# Patient Record
Sex: Female | Born: 1955 | Race: White | Hispanic: No | Smoking: Current every day smoker
Health system: Southern US, Community
[De-identification: ages and names within clinical notes are randomized; demographics above are authoritative.]

## PROBLEM LIST (undated history)

## (undated) DIAGNOSIS — M79605 Pain in left leg: Secondary | ICD-10-CM

## (undated) DIAGNOSIS — E079 Disorder of thyroid, unspecified: Secondary | ICD-10-CM

## (undated) DIAGNOSIS — T8859XA Other complications of anesthesia, initial encounter: Secondary | ICD-10-CM

## (undated) DIAGNOSIS — M51369 Other intervertebral disc degeneration, lumbar region without mention of lumbar back pain or lower extremity pain: Secondary | ICD-10-CM

## (undated) DIAGNOSIS — J189 Pneumonia, unspecified organism: Secondary | ICD-10-CM

## (undated) DIAGNOSIS — M25552 Pain in left hip: Secondary | ICD-10-CM

## (undated) DIAGNOSIS — K5900 Constipation, unspecified: Secondary | ICD-10-CM

## (undated) DIAGNOSIS — M5136 Other intervertebral disc degeneration, lumbar region: Secondary | ICD-10-CM

## (undated) DIAGNOSIS — I10 Essential (primary) hypertension: Secondary | ICD-10-CM

## (undated) DIAGNOSIS — E039 Hypothyroidism, unspecified: Secondary | ICD-10-CM

## (undated) DIAGNOSIS — M5410 Radiculopathy, site unspecified: Secondary | ICD-10-CM

## (undated) DIAGNOSIS — T4145XA Adverse effect of unspecified anesthetic, initial encounter: Secondary | ICD-10-CM

## (undated) DIAGNOSIS — R5383 Other fatigue: Secondary | ICD-10-CM

## (undated) DIAGNOSIS — R61 Generalized hyperhidrosis: Secondary | ICD-10-CM

## (undated) DIAGNOSIS — M549 Dorsalgia, unspecified: Secondary | ICD-10-CM

## (undated) HISTORY — PX: TONSILLECTOMY: SUR1361

## (undated) HISTORY — DX: Pain in left leg: M79.605

## (undated) HISTORY — DX: Dorsalgia, unspecified: M54.9

## (undated) HISTORY — DX: Pain in left hip: M25.552

## (undated) HISTORY — PX: ABDOMINAL HYSTERECTOMY: SHX81

## (undated) HISTORY — DX: Radiculopathy, site unspecified: M54.10

## (undated) HISTORY — DX: Disorder of thyroid, unspecified: E07.9

## (undated) HISTORY — DX: Constipation, unspecified: K59.00

## (undated) HISTORY — DX: Essential (primary) hypertension: I10

## (undated) HISTORY — PX: OTHER SURGICAL HISTORY: SHX169

## (undated) HISTORY — DX: Other intervertebral disc degeneration, lumbar region without mention of lumbar back pain or lower extremity pain: M51.369

## (undated) HISTORY — PX: SPINAL FUSION: SHX223

## (undated) HISTORY — DX: Other intervertebral disc degeneration, lumbar region: M51.36

## (undated) HISTORY — PX: GALLBLADDER SURGERY: SHX652

## (undated) HISTORY — PX: CHOLECYSTECTOMY: SHX55

## (undated) HISTORY — DX: Other fatigue: R53.83

## (undated) HISTORY — DX: Generalized hyperhidrosis: R61

---
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
## 1979-09-10 DIAGNOSIS — Z87442 Personal history of urinary calculi: Secondary | ICD-10-CM

## 1979-09-10 HISTORY — DX: Personal history of urinary calculi: Z87.442

## 2009-08-30 ENCOUNTER — Encounter (INDEPENDENT_AMBULATORY_CARE_PROVIDER_SITE_OTHER): Payer: Self-pay | Admitting: *Deleted

## 2010-09-09 HISTORY — PX: BACK SURGERY: SHX140

## 2011-05-01 ENCOUNTER — Other Ambulatory Visit: Payer: Self-pay | Admitting: Neurosurgery

## 2011-05-01 DIAGNOSIS — M419 Scoliosis, unspecified: Secondary | ICD-10-CM

## 2011-05-09 ENCOUNTER — Other Ambulatory Visit: Payer: Self-pay

## 2011-05-24 ENCOUNTER — Other Ambulatory Visit (HOSPITAL_COMMUNITY): Payer: Managed Care, Other (non HMO)

## 2011-05-29 ENCOUNTER — Encounter (HOSPITAL_COMMUNITY)
Admission: RE | Admit: 2011-05-29 | Discharge: 2011-05-29 | Disposition: A | Discharge status: Home / Self Care | Payer: Managed Care, Other (non HMO) | Source: Ambulatory Visit | Attending: Neurosurgery | Admitting: Neurosurgery

## 2011-05-29 LAB — SURGICAL PCR SCREEN: MRSA, PCR: NEGATIVE

## 2011-05-29 LAB — BASIC METABOLIC PANEL
BUN: 24 mg/dL — ABNORMAL HIGH (ref 6–23)
Chloride: 102 mEq/L (ref 96–112)
GFR calc Af Amer: >60 mL/min (ref >60)
GFR calc non Af Amer: >60 mL/min (ref >60)
Potassium: 4.7 mEq/L (ref 3.5–5.1)
Sodium: 137 mEq/L (ref 135–145)

## 2011-05-29 LAB — DIFFERENTIAL
Basophils Absolute: 0.1 10*3/uL (ref 0.0–0.1)
Basophils Relative: 1 % (ref 0–1)
Eosinophils Absolute: 0.7 10*3/uL (ref 0.0–0.7)
Eosinophils Relative: 7 % — ABNORMAL HIGH (ref 0–5)
Lymphocytes Relative: 36 % (ref 12–46)
Monocytes Absolute: 0.9 10*3/uL (ref 0.1–1.0)

## 2011-05-29 LAB — CBC
MCHC: 35.2 g/dL (ref 30.0–36.0)
Platelets: 317 10*3/uL (ref 150–400)
RDW: 14.2 % (ref 11.5–15.5)
WBC: 9.2 10*3/uL (ref 4.0–10.5)

## 2011-05-29 LAB — TYPE AND SCREEN: ABO/RH(D): O NEG

## 2011-05-29 LAB — ABO/RH: ABO/RH(D): O NEG

## 2011-05-30 ENCOUNTER — Inpatient Hospital Stay (HOSPITAL_COMMUNITY): Payer: Managed Care, Other (non HMO)

## 2011-05-30 ENCOUNTER — Inpatient Hospital Stay (HOSPITAL_COMMUNITY)
Admission: RE | Admit: 2011-05-30 | Discharge: 2011-06-02 | DRG: 458 | Disposition: A | Discharge status: Home Health | Payer: Managed Care, Other (non HMO) | Source: Ambulatory Visit | Attending: Neurosurgery | Admitting: Neurosurgery

## 2011-05-30 DIAGNOSIS — M4120 Other idiopathic scoliosis, site unspecified: Principal | ICD-10-CM | POA: Diagnosis present

## 2011-05-30 DIAGNOSIS — Z01812 Encounter for preprocedural laboratory examination: Secondary | ICD-10-CM

## 2011-05-30 DIAGNOSIS — M4310 Spondylolisthesis, site unspecified: Secondary | ICD-10-CM | POA: Diagnosis present

## 2011-05-30 DIAGNOSIS — M129 Arthropathy, unspecified: Secondary | ICD-10-CM | POA: Diagnosis present

## 2011-05-30 DIAGNOSIS — F17200 Nicotine dependence, unspecified, uncomplicated: Secondary | ICD-10-CM | POA: Diagnosis present

## 2011-05-30 DIAGNOSIS — M47817 Spondylosis without myelopathy or radiculopathy, lumbosacral region: Secondary | ICD-10-CM | POA: Diagnosis present

## 2011-06-04 NOTE — Op Note (Signed)
NAMEMarland Kitchen  Tammy Booth, Tammy Booth NO.:  [id number]  MEDICAL RECORD NO.:  [id number]  LOCATION:  3105                         FACILITY:  [facility]  PHYSICIAN:  Danae Orleans. Venetia Maxon, M.D.  DATE OF BIRTH:  [birth year]  DATE OF PROCEDURE:  05/30/2011 DATE OF DISCHARGE:                              OPERATIVE REPORT   PREOPERATIVE DIAGNOSES:  Lumbar scoliosis, spondylosis, spondylolisthesis stenosis, lumbar radiculopathy and low back pain.  POSTOPERATIVE DIAGNOSES:  Lumbar scoliosis, spondylosis, spondylolisthesis stenosis, lumbar radiculopathy and low back pain.  PROCEDURES: 1. L1-2, L2-3, L3-4, and L4-5 anterolateral decompression and fusion     with PEEK interbody cages and morselized allograft. 2. Posterior segmental pedicle screw fixation L1 through L5 levels     bilaterally.  SURGEON:  Danae Orleans. Venetia Maxon, MD  ASSISTANT:  Georgiann Cocker, RN and Hewitt Shorts, MD  ANESTHESIA:  General endotracheal anesthesia.  ESTIMATED BLOOD LOSS:  Minimal.  COMPLICATIONS:  None.  DISPOSITION:  To recovery.  INDICATIONS:  Tammy Booth is a 55 year old woman with severe lumbar scoliosis, spondylosis, spondylolisthesis stenosis and lumbar radiculopathy and low back pain, it was elected to take her to surgery for anterolateral decompression and fusion L1 through L5 levels.  PROCEDURE:  Tammy Booth was brought to the operating room.  Following satisfactory and uncomplicated induction of general endotracheal anesthesia, placement of intravenous lines and a Foley catheter, the patient was placed in left lateral decubitus position with axillary roll taped to the table for use of orthogonal intraoperative fluoroscopy. Initially, localizing at the L4-5 level, then moving up to the L1-2 level, anterolateral decompression and fusion was performed at each level.  The C-arm fluoroscopy was used throughout the surgery with AP and lateral visualization.  The thorough posterior incision was made  for finger dissection and then subsequently lateral incisions were made sequentially reorienting the table at each new level because of the rotatory component scoliosis.  At the L4-5 levels, thorough decompression was performed.  She had no level where there was any evidence of any neural stimulation at any point.  The electrical mapping was performed throughout the surgery, but initially at the L4-5 level, thorough decompression was performed with preparation at the endplates and removal of the disk material.  After trial sizing, it was elected to use a 10 x 22 x 50 10-degree lordotic implant which was packed with Osteocel Plus.  This was countersunk appropriately.  Attention was then turned to the L3-4 level where thorough decompression was performed and at this level, an 8 x 50 x 22 mm implant was packed in a similar fashion, inserted was countersunk appropriately.  At the L2-3 level, a coronal tapered implant was utilized as this was the apex of the scoliotic curvature and this was a 10 x 18 x 50-mm implant, which was packed in a similar fashion.  At the L1-2 level, the bone was quite soft and it was elected to place an 8 x 18 x 45-mm implant packed in a similar fashion where site subsidence in the superior endplate of L2, no violation of the L1 endplates.  Hemostasis was assured.  The wounds were irrigated and closed with Vicryl sutures, reapproximated the fascia, subcutaneous tissues and  the skin edges.  Dermabond was placed.  The patient was then turned to the prone position on the operating table on chest rolls and her back was then prepped and draped in usual sterile fashion.  Using percutaneous pedicle screw fixation, L1 through L5, 40 x 6.5-mm screws were placed at L5, 6.5 x 45-mm screws were placed at L1, L2, L3, and L4 levels.  A 160-mm rods were lordosed to fit the curvature and then were placed through separate stab incision.  They were locked down in situ with the  exception of the L2-3 convex side, which was compressed.  Final x-ray showed well-positioned interbody grafts, pedicle screw fixation at each level.  Wounds were irrigated and closed with 0 Vicryl, 2-0 and 3-0 Vicryl sutures.  The skin edges were reapproximated with Dermabond.  The patient was extubated in the operating room, and taken to the recovery in stable and satisfactory condition having tolerated the operation well.  Counts were correct at the end of the case.     Danae Orleans. Venetia Maxon, M.D.     JDS/MEDQ  D:  05/30/2011  T:  05/30/2011  Job:  [id number]  Electronically Signed by Maeola Harman M.D. on 06/04/2011 10:37:44 AM

## 2011-06-20 NOTE — Discharge Summary (Signed)
  NAMEMarland Kitchen  Tammy, Booth NO.:  [id number]  MEDICAL RECORD NO.:  [id number]  LOCATION:  3017                         FACILITY:  [facility]  PHYSICIAN:  Danae Orleans. Venetia Maxon, M.D.  DATE OF BIRTH:  [birth year]  DATE OF ADMISSION:  05/30/2011 DATE OF DISCHARGE:  06/02/2011                              DISCHARGE SUMMARY   PREADMISSION DIAGNOSES:  Lumbar scoliosis, spondylosis, spondylolisthesis, stenosis, lumbar radiculopathy and low back pain.  FINAL DIAGNOSES:  Lumbar scoliosis, spondylosis, spondylolisthesis, stenosis, lumbar radiculopathy and low back pain.  HISTORY OF ILLNESS AND HOSPITAL COURSE:  Tammy Booth is a 55 year old woman with severe back, lumbar scoliosis, spondylosis, spondylolisthesis, stenosis and lumbar radiculopathy.  It was elected to take her to surgery for anterolateral decompression and fusion at L1-L5 levels.  The patient was initially placed in a lateral position and underwent anterolateral decompression and fusion L1-L2, L2-3, L3-4 and L4-5 levels.  Subsequent to that is the same surgery she was repositioned in a prone position and underwent percutaneous pedicle screw fixation, L1-L5 levels.  She tolerated these procedures well, had no apparent complications postoperatively, was initially observed in the ICU postoperatively and was gradually mobilized, had an arterial line which was discontinued, was doing well on the 23rd and was discharged home on the 23rd with instructions of wearing back brace when up. Follow up with Dr. Venetia Maxon in the office 3 weeks postoperatively.  DISCHARGE MEDICATIONS:  Preoperative medications of vitamin D, vitamin C, multivitamin and calcium carbonate with final diagnoses same as admission diagnoses.     Danae Orleans. Venetia Maxon, M.D.     JDS/MEDQ  D:  06/18/2011  T:  06/19/2011  Job:  [id number]  Electronically Signed by Maeola Harman M.D. on 06/20/2011 09:37:09 AM

## 2013-09-09 DIAGNOSIS — C801 Malignant (primary) neoplasm, unspecified: Secondary | ICD-10-CM

## 2013-09-09 HISTORY — DX: Malignant (primary) neoplasm, unspecified: C80.1

## 2015-09-10 HISTORY — PX: CATARACT EXTRACTION, BILATERAL: SHX1313

## 2015-09-10 HISTORY — PX: EYE SURGERY: SHX253

## 2019-12-20 ENCOUNTER — Encounter: Payer: Self-pay | Admitting: Vascular Surgery

## 2019-12-21 ENCOUNTER — Other Ambulatory Visit: Payer: Self-pay | Admitting: Neurosurgery

## 2019-12-22 ENCOUNTER — Other Ambulatory Visit: Payer: Self-pay

## 2020-01-10 ENCOUNTER — Ambulatory Visit (INDEPENDENT_AMBULATORY_CARE_PROVIDER_SITE_OTHER): Payer: BC Managed Care – PPO | Admitting: Surgery

## 2020-01-10 ENCOUNTER — Other Ambulatory Visit: Payer: Self-pay

## 2020-01-10 ENCOUNTER — Encounter: Payer: Self-pay | Admitting: Surgery

## 2020-01-10 VITALS — BP 138/87 | HR 68 | Temp 97.9°F | Resp 20 | Ht 65.0 in | Wt 134.0 lb

## 2020-01-10 DIAGNOSIS — M479 Spondylosis, unspecified: Secondary | ICD-10-CM | POA: Diagnosis not present

## 2020-01-10 NOTE — Progress Notes (Signed)
[facility]  Patient name: Tammy Booth MRN: [id number] DOB: Dec 14, 1955 Sex: female   REQUESTING PROVIDER:    Dr. Vertell Limber    REASON FOR CONSULT:    ALIF L5-S1  HISTORY OF PRESENT ILLNESS:   Tammy Booth is a 64 y.o. female, who is referred for evaluation of anterior exposure of L5-S1.  Patient has a history of prior back surgery and is having recurrent issues.  Anterior approach has been recommended in addition to additional posterior work.  The patient's only abdominal surgeries have been a hysterectomy via a low transverse C-section incision and a laparoscopic cholecystectomy.  She is a smoker.  PAST MEDICAL HISTORY    Past Medical History:  Diagnosis Date  . Back pain   . Constipation   . DDD (degenerative disc disease), lumbar   . Fatigue   . Hypertension    PRIMARY  . Left hip pain   . Left leg pain   . Night sweats   . Radiculopathy    Lumbar region  . Thyroid disease      FAMILY HISTORY   Family History  Problem Relation Age of Onset  . Diabetes Mother   . Cancer Mother   . Hypertension Mother   . Heart disease Father   . Hypertension Father   . Cancer Father   . Cancer Sister   . Hypertension Sister   . Diabetes Other   . Cancer Other   . Dementia Other     SOCIAL HISTORY:   Social History   Socioeconomic History  . Marital status: Widowed    Spouse name: Not on file  . Number of children: Not on file  . Years of education: Not on file  . Highest education level: Not on file  Occupational History  . Occupation: Disabled  Tobacco Use  . Smoking status: Current Every Day Smoker    Packs/day: 1.50    Years: 44.00    Pack years: 66.00  Substance and Sexual Activity  . Alcohol use: Never  . Drug use: Never  . Sexual activity: Not on file  Other Topics Concern  . Not on file  Social History Narrative  . Not on file   Social Determinants of Health   Financial  Resource Strain:   . Difficulty of Paying Living Expenses:   Food Insecurity:   . Worried About Charity fundraiser in the Last Year:   . Arboriculturist in the Last Year:   Transportation Needs:   . Film/video editor (Medical):   Marland Kitchen Lack of Transportation (Non-Medical):   Physical Activity:   . Days of Exercise per Week:   . Minutes of Exercise per Session:   Stress:   . Feeling of Stress :   Social Connections:   . Frequency of Communication with Friends and Family:   . Frequency of Social Gatherings with Friends and Family:   . Attends Religious Services:   . Active Member of Clubs or Organizations:   . Attends Archivist Meetings:   Marland Kitchen Marital Status:   Intimate Partner Violence:   . Fear of Current or Ex-Partner:   . Emotionally Abused:   Marland Kitchen Physically Abused:   . Sexually Abused:     ALLERGIES:    No Known Allergies  CURRENT MEDICATIONS:    Current Outpatient Medications  Medication Sig Dispense Refill  . Calcium Carb-Cholecalciferol (CALCIUM 500+D PO) Take by mouth.    . carvedilol (COREG)  3.125 MG tablet Take 3.125 mg by mouth 2 (two) times daily with a meal.    . Cholecalciferol (VITAMIN D3) 1.25 MG (50000 UT) TABS Take by mouth.    Marland Kitchen ibuprofen (ADVIL) 200 MG tablet Take 200 mg by mouth every 6 (six) hours as needed.    Marland Kitchen levothyroxine (SYNTHROID) 112 MCG tablet Take 112 mcg by mouth daily before breakfast.    . LOW-DOSE ASPIRIN PO Take by mouth.    . polycarbophil (FIBERCON) 625 MG tablet Take 625 mg by mouth daily.     No current facility-administered medications for this visit.    REVIEW OF SYSTEMS:   [X]  denotes positive finding, [ ]  denotes negative finding Cardiac  Comments:  Chest pain or chest pressure:    Shortness of breath upon exertion:    Short of breath when lying flat:    Irregular heart rhythm:        Vascular    Pain in calf, thigh, or hip brought on by ambulation:    Pain in feet at night that wakes you up from your  sleep:     Blood clot in your veins:    Leg swelling:         Pulmonary    Oxygen at home:    Productive cough:     Wheezing:         Neurologic    Sudden weakness in arms or legs:     Sudden numbness in arms or legs:     Sudden onset of difficulty speaking or slurred speech:    Temporary loss of vision in one eye:     Problems with dizziness:         Gastrointestinal    Blood in stool:      Vomited blood:         Genitourinary    Burning when urinating:     Blood in urine:        Psychiatric    Major depression:         Hematologic    Bleeding problems:    Problems with blood clotting too easily:        Skin    Rashes or ulcers:        Constitutional    Fever or chills:     PHYSICAL EXAM:   There were no vitals filed for this visit.  GENERAL: The patient is a well-nourished female, in no acute distress. The vital signs are documented above. CARDIAC: There is a regular rate and rhythm.  VASCULAR: Palpable pedal pulses PULMONARY: Nonlabored respirations ABDOMEN: Soft and non-tender  MUSCULOSKELETAL: There are no major deformities or cyanosis. NEUROLOGIC: No focal weakness or paresthesias are detected. SKIN: There are no ulcers or rashes noted. PSYCHIATRIC: The patient has a normal affect.  STUDIES:   I have reviewed her prior x-rays and MRI which did not show any significant calcification within her vasculature  ASSESSMENT and PLAN   The patient is a good candidate for anterior exposure of the L5-S1 disc space.  I discussed the risk benefits of the procedure including but not limited to the risk of injury to the iliac artery and vein, the ureter.  We also discussed potential wound issues including infection and hernia.  All of her questions were answered.  Her procedure been scheduled for May 20.   Leia Alf, MD, FACS [facility] [phone] Pager [phone]

## 2020-01-21 NOTE — Progress Notes (Signed)
[id number] [address] [place] [address] Phone: [phone] Fax: [phone]      Your procedure is scheduled on Thursday, Jan 27, 2020.  Report to [facility] Main Entrance "A" at 5:30 A.M., and check in at the Admitting office.  Call this number if you have problems the morning of surgery:  [phone]  Call [phone] if you have any questions prior to your surgery date Monday-Friday 8am-4pm    Remember:  Do not eat or drink after midnight the night before your surgery    Take these medicines the morning of surgery with A SIP OF WATER:  carvedilol (COREG)  levothyroxine (SYNTHROID)   As of today, STOP taking any Aspirin (unless otherwise instructed by your surgeon) and Aspirin containing products, Aleve, Naproxen, Ibuprofen, Motrin, Advil, Goody's, BC's, all herbal medications, fish oil, and all vitamins.                      Do not wear jewelry, make up, or nail polish            Do not wear lotions, powders, perfumes, or deodorant.            Do not shave 48 hours prior to surgery.            Do not bring valuables to the hospital.            [facility] is not responsible for any belongings or valuables.  Do NOT Smoke (Tobacco/Vapping) or drink Alcohol 24 hours prior to your procedure If you use a CPAP at night, you may bring all equipment for your overnight stay.   Contacts, glasses, dentures or bridgework may not be worn into surgery.      For patients admitted to the hospital, discharge time will be determined by your treatment team.   Patients discharged the day of surgery will not be allowed to drive home, and someone needs to stay with them for 24 hours.    Special instructions:   [facility]- Preparing For Surgery  Before surgery, you can play an important role. Because skin is not sterile, your skin needs to be as free of germs as possible. You can reduce the number of  germs on your skin by washing with CHG (chlorahexidine gluconate) Soap before surgery.  CHG is an antiseptic cleaner which kills germs and bonds with the skin to continue killing germs even after washing.    Oral Hygiene is also important to reduce your risk of infection.  Remember - BRUSH YOUR TEETH THE MORNING OF SURGERY WITH YOUR REGULAR TOOTHPASTE  Please do not use if you have an allergy to CHG or antibacterial soaps. If your skin becomes reddened/irritated stop using the CHG.  Do not shave (including legs and underarms) for at least 48 hours prior to first CHG shower. It is OK to shave your face.  Please follow these instructions carefully.   1. Shower the NIGHT BEFORE SURGERY and the MORNING OF SURGERY with CHG Soap.   2. If you chose to wash your hair, wash your hair first as usual with your normal shampoo.  3. After you shampoo, rinse your hair and body thoroughly to remove the shampoo.  4. Use CHG as you would any other liquid soap. You can apply CHG directly to the skin and wash gently with a scrungie or a clean washcloth.   5. Apply the  CHG Soap to your body ONLY FROM THE NECK DOWN.  Do not use on open wounds or open sores. Avoid contact with your eyes, ears, mouth and genitals (private parts). Wash Face and genitals (private parts)  with your normal soap.   6. Wash thoroughly, paying special attention to the area where your surgery will be performed.  7. Thoroughly rinse your body with warm water from the neck down.  8. DO NOT shower/wash with your normal soap after using and rinsing off the CHG Soap.  9. Pat yourself dry with a CLEAN TOWEL.  10. Wear CLEAN PAJAMAS to bed the night before surgery, wear comfortable clothes the morning of surgery  11. Place CLEAN SHEETS on your bed the night of your first shower and DO NOT SLEEP WITH PETS.   Day of Surgery:   Do not apply any deodorants/lotions.  Please wear clean clothes to the hospital/surgery center.   Remember to  brush your teeth WITH YOUR REGULAR TOOTHPASTE.   Please read over the following fact sheets that you were given.

## 2020-01-24 ENCOUNTER — Inpatient Hospital Stay (HOSPITAL_COMMUNITY)
Admission: RE | Admit: 2020-01-24 | Discharge: 2020-01-24 | Disposition: A | Discharge status: Home / Self Care | Payer: Managed Care, Other (non HMO) | Source: Ambulatory Visit

## 2020-01-24 ENCOUNTER — Other Ambulatory Visit (HOSPITAL_COMMUNITY)
Admission: RE | Admit: 2020-01-24 | Discharge: 2020-01-24 | Disposition: A | Discharge status: Home / Self Care | Payer: BC Managed Care – PPO | Source: Ambulatory Visit | Attending: Orthopedic Surgery | Admitting: Orthopedic Surgery

## 2020-01-24 NOTE — H&P (Signed)
Patient ID:   [id number] Patient: Tammy Booth  Date of Birth: 08/23/1956 Visit Type: Office Visit   Date: 12/01/2019 02:15 PM Provider: Benito Lemmerman D. Alishia Lebo MD   This 64 year old female presents for MRI Review/back pain.  HISTORY OF PRESENT ILLNESS: 1.  MRI Review/back pain  Patient returns to review her MRI  Lumbar MRI read demonstrates severe left foraminal stenosis L5-S1.  She also has degeneration and scoliosis at the T12-L1 level on the right.  I have reviewed her scoliosis x-rays and discussed treatment options.  She has exhausted relief with L5 nerve blocks and is now having constant and unrelenting pain due to this nerve compression.  She has weakness in an L5 distribution with EHL and dorsiflexion weakness at 4/5 on the left with a positive seated straight leg raise at this level  I have recommended after lengthy discussion with the patient, ALIF at L5-S1 with extension of posterior fusion to the pelvis and also to the level of T10  Patient has a 21 degree Cobb angle at T 12 - L 1 and +13 degrees lumbar mismatch.    She describes her pain level as 5/10 consistently.    She is working on stopping smoking.      Medical/Surgical/Interim History Reviewed, no change.  Last detailed document date:10/15/2017.     PAST MEDICAL HISTORY, SURGICAL HISTORY, FAMILY HISTORY, SOCIAL HISTORY AND REVIEW OF SYSTEMS I have reviewed the patient's past medical, surgical, family and social history as well as the comprehensive review of systems as included on the [facility] history form dated 07/22/2019, which I have signed.  Family History: Reviewed, no changes.  Last detailed document date:10/15/2017.   Social History: Reviewed, no changes. Last detailed document date: 10/15/2017.    MEDICATIONS: (added, continued or stopped this visit) Started Medication Directions Instruction Stopped  Aspirin Low Dose 81 mg tablet,delayed  release     Calcium 500  500 mg calcium (1,250 mg) tablet     carvedilol 3.125 mg tablet     FiberCon 625 mg tablet     ibuprofen 200 mg tablet     levothyroxine 112 mcg tablet take 1 tablet by oral route  every day    Vitamin D3  ORAL       ALLERGIES: Ingredient Reaction Medication Name Comment NO KNOWN ALLERGIES    No known allergies. Reviewed, no changes.    PHYSICAL EXAM:  Vitals Date Temp F BP Pulse Ht In Wt Lb BMI BSA Pain Score 12/01/2019 97.3 132/82 71 65 136.4 22.7  5/10     IMPRESSION:  Progressive worsening of scoliosis and left L5 nerve root compression  PLAN: Proceed with ALIF L5-S1 with posterior fixation T10 to pelvis with exploration of prior fusion L1 through L5 levels with Vascular Surgery and intraoperative CT navigation with Airo.  Orders: Diagnostic Procedures: Assessment Procedure M54.16 Scoliosis- AP/Lat Instruction(s)/Education: Assessment Instruction I10 Lifestyle education Miscellaneous: Assessment  M41.26 TLSO Brace (Drawstring)  Completed Orders (this encounter) Order Details Reason Side Interpretation Result Initial Treatment Date Region Lifestyle education Patient will follow up with Primary Care Physician.        Assessment/Plan  # Detail Type Description  1. Assessment Low back pain, unspecified back pain laterality, with sciatica presence unspecified (M54.5).     2. 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  Plan Orders TLSO Brace (Drawstring). Clinical information/comments: [facility] - given to patient.     3. Assessment Radiculopathy, lumbar region (M54.16).     4.   Assessment Degenerative lumbar spinal stenosis (M48.061).     5. Assessment Essential (primary) hypertension (I10).       Pain Management Plan Pain Scale: 5/10. Method: Numeric Pain Intensity Scale. Location: back. Onset: 06/14/2017. Duration:  varies. Quality: discomforting. Pain management follow-up plan of care: Patient will continue medication management..              Provider:  Varian Innes D. Faatimah Spielberg MD  12/06/2019 08:40 AM    Dictation edited by: Winferd Wease D. Dejohn Ibarra    CC Providers: John  [place] [address] [place] [address] [place]  [address]   Yeriel Mineo MD  [address] [place], [place] [address]               Electronically signed by Lauriana Denes D. Alley Neils MD on 12/06/2019 08:40 AM  

## 2020-01-27 ENCOUNTER — Encounter (HOSPITAL_COMMUNITY): Admission: RE | Payer: Self-pay | Source: Home / Self Care

## 2020-01-27 ENCOUNTER — Inpatient Hospital Stay (HOSPITAL_COMMUNITY): Admission: RE | Admit: 2020-01-27 | Payer: BC Managed Care – PPO | Source: Home / Self Care | Admitting: Neurosurgery

## 2020-01-27 SURGERY — ANTERIOR LUMBAR FUSION 1 LEVEL
Anesthesia: General | Site: Back

## 2020-01-31 ENCOUNTER — Other Ambulatory Visit: Payer: Self-pay | Admitting: Neurosurgery

## 2020-02-11 ENCOUNTER — Other Ambulatory Visit: Payer: Self-pay

## 2020-03-07 NOTE — H&P (Signed)
Patient ID:   [id number] Patient: Tammy Booth  Date of Birth: 01/07/1956 Visit Type: Office Visit   Date: 12/01/2019 02:15 PM Provider: Kharlie Bring D. Lovelyn Sheeran MD   This 64 year old female presents for MRI Review/back pain.  HISTORY OF PRESENT ILLNESS: 1.  MRI Review/back pain  Patient returns to review her MRI  Lumbar MRI read demonstrates severe left foraminal stenosis L5-S1.  She also has degeneration and scoliosis at the T12-L1 level on the right.  I have reviewed her scoliosis x-rays and discussed treatment options.  She has exhausted relief with L5 nerve blocks and is now having constant and unrelenting pain due to this nerve compression.  She has weakness in an L5 distribution with EHL and dorsiflexion weakness at 4/5 on the left with a positive seated straight leg raise at this level  I have recommended after lengthy discussion with the patient, ALIF at L5-S1 with extension of posterior fusion to the pelvis and also to the level of T10  Patient has a 21 degree Cobb angle at T 12 - L 1 and +13 degrees lumbar mismatch.    She describes her pain level as 5/10 consistently.    She is working on stopping smoking.      Medical/Surgical/Interim History Reviewed, no change.  Last detailed document date:10/15/2017.     PAST MEDICAL HISTORY, SURGICAL HISTORY, FAMILY HISTORY, SOCIAL HISTORY AND REVIEW OF SYSTEMS I have reviewed the patient's past medical, surgical, family and social history as well as the comprehensive review of systems as included on the  [facility] history form dated 07/22/2019, which I have signed.  Family History: Reviewed, no changes.  Last detailed document date:10/15/2017.   Social History: Reviewed, no changes. Last detailed document date: 10/15/2017.    MEDICATIONS: (added, continued or stopped this visit) Started Medication Directions Instruction Stopped  Aspirin Low Dose 81 mg tablet,delayed  release     Calcium 500  500 mg calcium (1,250 mg) tablet     carvedilol 3.125 mg tablet     FiberCon 625 mg tablet     ibuprofen 200 mg tablet     levothyroxine 112 mcg tablet take 1 tablet by oral route  every day    Vitamin D3  ORAL       ALLERGIES: Ingredient Reaction Medication Name Comment NO KNOWN ALLERGIES    No known allergies. Reviewed, no changes.    PHYSICAL EXAM:  Vitals Date Temp F BP Pulse Ht In Wt Lb BMI BSA Pain Score 12/01/2019 97.3 132/82 71 65 136.4 22.7  5/10     IMPRESSION:  Progressive worsening of scoliosis and left L5 nerve root compression  PLAN: Proceed with ALIF L5-S1 with posterior fixation T10 to pelvis with exploration of prior fusion L1 through L5 levels with Vascular Surgery and intraoperative CT navigation with Airo.  Orders: Diagnostic Procedures: Assessment Procedure M54.16 Scoliosis- AP/Lat Instruction(s)/Education: Assessment Instruction I10 Lifestyle education Miscellaneous: Assessment  M41.26 TLSO Brace (Drawstring)  Completed Orders (this encounter) Order Details Reason Side Interpretation Result Initial Treatment Date Region Lifestyle education Patient will follow up with Primary Care Physician.        Assessment/Plan  # Detail Type Description  1. Assessment Low back pain, unspecified back pain laterality, with sciatica presence unspecified (M54.5).     2. 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  Plan Orders TLSO Brace (Drawstring). Clinical information/comments: COPC - given to patient.     3. Assessment Radiculopathy, lumbar region (M54.16).     4.   Assessment Degenerative lumbar spinal stenosis (M48.061).     5. Assessment Essential (primary) hypertension (I10).       Pain Management Plan Pain Scale: 5/10. Method: Numeric Pain Intensity Scale. Location: back. Onset: 06/14/2017. Duration:  varies. Quality: discomforting. Pain management follow-up plan of care: Patient will continue medication management..              Provider:  Charlese Gruetzmacher D. Anndee Connett MD  12/06/2019 08:40 AM    Dictation edited by: Mubarak Bevens D. Laurian Edrington    CC Providers: John  [place] [address] [place] [address] [place]  [address]   Mel Langan MD  [address] [place], [place] [address]               Electronically signed by Autymn Omlor D. Kaylina Cahue MD on 12/06/2019 08:40 AM  

## 2020-03-20 ENCOUNTER — Other Ambulatory Visit (HOSPITAL_COMMUNITY)
Admission: RE | Admit: 2020-03-20 | Discharge: 2020-03-20 | Disposition: A | Discharge status: Home / Self Care | Payer: BC Managed Care – PPO | Source: Ambulatory Visit | Attending: Neurosurgery | Admitting: Neurosurgery

## 2020-03-20 ENCOUNTER — Encounter (HOSPITAL_COMMUNITY): Payer: Self-pay

## 2020-03-20 ENCOUNTER — Other Ambulatory Visit: Payer: Self-pay

## 2020-03-20 ENCOUNTER — Encounter (HOSPITAL_COMMUNITY)
Admission: RE | Admit: 2020-03-20 | Discharge: 2020-03-20 | Disposition: A | Discharge status: Home / Self Care | Payer: BC Managed Care – PPO | Source: Ambulatory Visit | Attending: Neurosurgery | Admitting: Neurosurgery

## 2020-03-20 DIAGNOSIS — Z20822 Contact with and (suspected) exposure to covid-19: Secondary | ICD-10-CM | POA: Diagnosis not present

## 2020-03-20 DIAGNOSIS — Z01812 Encounter for preprocedural laboratory examination: Secondary | ICD-10-CM | POA: Insufficient documentation

## 2020-03-20 DIAGNOSIS — I10 Essential (primary) hypertension: Secondary | ICD-10-CM | POA: Diagnosis not present

## 2020-03-20 DIAGNOSIS — F17210 Nicotine dependence, cigarettes, uncomplicated: Secondary | ICD-10-CM | POA: Diagnosis not present

## 2020-03-20 HISTORY — DX: Hypothyroidism, unspecified: E03.9

## 2020-03-20 LAB — TYPE AND SCREEN
ABO/RH(D): O NEG
Antibody Screen: NEGATIVE

## 2020-03-20 LAB — SURGICAL PCR SCREEN
MRSA, PCR: NEGATIVE
Staphylococcus aureus: NEGATIVE

## 2020-03-20 LAB — BASIC METABOLIC PANEL
Anion gap: 11 (ref 5–15)
BUN: 25 mg/dL — ABNORMAL HIGH (ref 8–23)
CO2: 23 mmol/L (ref 22–32)
Calcium: 9.4 mg/dL (ref 8.9–10.3)
Chloride: 106 mmol/L (ref 98–111)
Creatinine, Ser: 1.34 mg/dL — ABNORMAL HIGH (ref 0.44–1.00)
GFR calc Af Amer: 48 mL/min — ABNORMAL LOW (ref >60)
GFR calc non Af Amer: 42 mL/min — ABNORMAL LOW (ref >60)
Glucose, Bld: 132 mg/dL — ABNORMAL HIGH (ref 70–99)
Potassium: 3.9 mmol/L (ref 3.5–5.1)
Sodium: 140 mmol/L (ref 135–145)

## 2020-03-20 LAB — CBC
HCT: 40.3 % (ref 36.0–46.0)
Hemoglobin: 13.5 g/dL (ref 12.0–15.0)
MCH: 29.6 pg (ref 26.0–34.0)
MCHC: 33.5 g/dL (ref 30.0–36.0)
MCV: 88.4 fL (ref 80.0–100.0)
Platelets: 404 10*3/uL — ABNORMAL HIGH (ref 150–400)
RBC: 4.56 MIL/uL (ref 3.87–5.11)
RDW: 15 % (ref 11.5–15.5)
WBC: 8.3 10*3/uL (ref 4.0–10.5)
nRBC: 0 % (ref 0.0–0.2)

## 2020-03-20 LAB — SARS CORONAVIRUS 2 (TAT 6-24 HRS): SARS Coronavirus 2: NEGATIVE

## 2020-03-20 NOTE — Progress Notes (Signed)
Your procedure is scheduled on Thursday July 15.  Report to [facility] Main Entrance "A" at 05:30 A.M., and check in at the Admitting office.  Call this number if you have problems the morning of surgery: [phone]  Call [phone] if you have any questions prior to your surgery date Monday-Friday 8am-4pm   Remember: Do not eat or drink after midnight the night before your surgery  Take these medicines the morning of surgery with A SIP OF WATER: carvedilol (COREG)  levothyroxine (SYNTHROID)  As of today, STOP taking any Aspirin (unless otherwise instructed by your surgeon), Aleve, Naproxen, Ibuprofen, Motrin, Advil, Goody's, BC's, all herbal medications, fish oil, and all vitamins.    The Morning of Surgery  Do not wear jewelry, make-up or nail polish.  Do not wear lotions, powders, or perfumes, or deodorant  Do not shave 48 hours prior to surgery.    Do not bring valuables to the hospital.  [facility] is not responsible for any belongings or valuables.  If you are a smoker, DO NOT Smoke 24 hours prior to surgery  If you wear a CPAP at night please bring your mask the morning of surgery   Remember that you must have someone to transport you home after your surgery, and remain with you for 24 hours if you are discharged the same day.   Please bring cases for contacts, glasses, hearing aids, dentures or bridgework because it cannot be worn into surgery.    Leave your suitcase in the car.  After surgery it may be brought to your room.  For patients admitted to the hospital, discharge time will be determined by your treatment team.  Patients discharged the day of surgery will not be allowed to drive home.    Special instructions:   [facility]- Preparing For Surgery  Before surgery, you can play an important role. Because skin is not sterile, your skin needs to be as free of germs as possible. You can reduce the number of germs on your skin by washing with CHG  (chlorahexidine gluconate) Soap before surgery.  CHG is an antiseptic cleaner which kills germs and bonds with the skin to continue killing germs even after washing.    Oral Hygiene is also important to reduce your risk of infection.  Remember - BRUSH YOUR TEETH THE MORNING OF SURGERY WITH YOUR REGULAR TOOTHPASTE  Please do not use if you have an allergy to CHG or antibacterial soaps. If your skin becomes reddened/irritated stop using the CHG.  Do not shave (including legs and underarms) for at least 48 hours prior to first CHG shower. It is OK to shave your face.  Please follow these instructions carefully.   1. Shower the NIGHT BEFORE SURGERY and the MORNING OF SURGERY with CHG Soap.   2. If you chose to wash your hair and body, wash as usual with your normal shampoo and body-wash/soap.  3. Rinse your hair and body thoroughly to remove the shampoo and soap.  4. Apply CHG directly to the skin (ONLY FROM THE NECK DOWN) and wash gently with a scrungie or a clean washcloth.   5. Do not use on open wounds or open sores. Avoid contact with your eyes, ears, mouth and genitals (private parts). Wash Face and genitals (private parts)  with your normal soap.   6. Wash thoroughly, paying special attention to the area where your surgery will be performed.  7. Thoroughly rinse your body with warm water from the neck down.  8. DO NOT shower/wash with your normal soap after using and rinsing off the CHG Soap.  9. Pat yourself dry with a CLEAN TOWEL.  10. Wear CLEAN PAJAMAS to bed the night before surgery  11. Place CLEAN SHEETS on your bed the night of your first shower and DO NOT SLEEP WITH PETS.  12. Wear comfortable clothes the morning of surgery.     Day of Surgery:  Please shower the morning of surgery with the CHG soap Do not apply any deodorants/lotions. Please wear clean clothes to the hospital/surgery center.   Remember to brush your teeth WITH YOUR REGULAR TOOTHPASTE.   Please  read over the following fact sheets that you were given.

## 2020-03-20 NOTE — Progress Notes (Addendum)
PCP - Margo Aye, MD Cardiologist - Gibson Ramp, MD    Chest x-ray - n/a EKG - 03/20/20 Stress Test - requested from [place], MD ECHO - Requested from [place], MD Cardiac Cath - pt denies   Blood Thinner Instructions:n/a Aspirin Instructions:n/a  ERAS Protcol -n/a PRE-SURGERY Ensure or G2- n/a  COVID TEST- 03/20/20  Coronavirus Screening  Have you experienced the following symptoms:  Cough yes/no: No Fever (>100.15F)  yes/no: No Runny nose yes/no: No Sore throat yes/no: No Difficulty breathing/shortness of breath  yes/no: No  Have you or a family member traveled in the last 14 days and where? yes/no: No   If the patient indicates "YES" to the above questions, their PAT will be rescheduled to limit the exposure to others and, the surgeon will be notified. THE PATIENT WILL NEED TO BE ASYMPTOMATIC FOR 14 DAYS.   If the patient is not experiencing any of these symptoms, the PAT nurse will instruct them to NOT bring anyone with them to their appointment since they may have these symptoms or traveled as well.   Please remind your patients and families that hospital visitation restrictions are in effect and the importance of the restrictions.     Anesthesia review: ECHO and stress tests requested from [place] - [place], MD  Patient denies shortness of breath, fever, cough and chest pain at PAT appointment   All instructions explained to the patient, with a verbal understanding of the material. Patient agrees to go over the instructions while at home for a better understanding. Patient also instructed to self quarantine after being tested for COVID-19. The opportunity to ask questions was provided.

## 2020-03-20 NOTE — Progress Notes (Signed)
Tammy Hanks, RN called to verify consent order (Lumbar 5 Sacral 1 Anterior lumbar interbody fusion with Thoracic 10 to Lumbar 1 fixation, Sacral 1 to Pelvis fixation, exploration of previous fusion) - thoracic 10 to lumbar 1 fixation was modified from "Thoracic to lumber 1 fixation". Pt informed of modification and agreed this was the correct procedure discussed with Dr. Vertell Limber.

## 2020-03-21 ENCOUNTER — Encounter (HOSPITAL_COMMUNITY): Payer: Self-pay | Admitting: Physician Assistant

## 2020-03-21 ENCOUNTER — Other Ambulatory Visit (HOSPITAL_COMMUNITY): Payer: BC Managed Care – PPO

## 2020-03-21 ENCOUNTER — Encounter (HOSPITAL_COMMUNITY): Payer: Self-pay | Admitting: Anesthesiology

## 2020-03-22 ENCOUNTER — Encounter (HOSPITAL_COMMUNITY): Payer: Self-pay | Admitting: Neurosurgery

## 2020-03-22 NOTE — Progress Notes (Signed)
Anesthesia Chart Review:  64 year old female current smoker with history of hypertension scheduled for anterior posterior multilevel revision lumbar fusion on 03/23/2020 with Dr. Vertell Limber and Dr. Trula Slade.  November 2019 she was seen by cardiologist Dr. Gibson Ramp in [place] for evaluation of progressive chest discomfort.  Per the report, the test was of poor quality.  Per Dr. Real Cons note 08/26/2018, "08/05/2018 stress echo likely normal however very poor quality study."  Echocardiogram was also evidently poor quality.  Per note, "08/04/2018 echo: Poor quality study EF 02%, grade 2 diastolic dysfunction."  In his assessment Dr. Gibson Ramp states, "patient with continued episodes of chest discomfort that could potentially represent angina however, stress test ruled out significant high risk disease.  Her symptoms are stable if not improving somewhat on beta-blocker therapy.  Patient was offered a more aggressive approach including nuclear stress testing and/or catheterization versus a conservative approach with watchful waiting.  Patient elected to proceed with watchful waiting is a certain acceptable.  We will continue carvedilol, and aspirin.  We will follow up in 3 months time."  It does not appear patient has had cardiology follow-up since that visit.  Discussed case with Dr. Smith Robert.  He reviewed patient's history and cardiology notes.  He advised that the patient should be evaluated by cardiology prior to undergoing elective surgery.  I have relayed this to Dr. Julaine Fusi surgical scheduler.  Preop labs reviewed, creatinine mildly elevated 1.34.  Otherwise unremarkable.  EKG 03/20/2020: NSR.  Rate 80.   Wynonia Musty [facility]/Anesthesiology Phone [phone] 03/22/2020 1:17 PM

## 2020-03-23 ENCOUNTER — Inpatient Hospital Stay (HOSPITAL_COMMUNITY): Admission: RE | Admit: 2020-03-23 | Payer: BC Managed Care – PPO | Source: Home / Self Care | Admitting: Neurosurgery

## 2020-03-23 ENCOUNTER — Encounter (HOSPITAL_COMMUNITY): Admission: RE | Payer: Self-pay | Source: Home / Self Care

## 2020-03-23 SURGERY — ANTERIOR LUMBAR FUSION 1 LEVEL
Anesthesia: General | Site: Back

## 2020-04-05 ENCOUNTER — Other Ambulatory Visit: Payer: Self-pay | Admitting: Neurosurgery

## 2020-04-05 ENCOUNTER — Other Ambulatory Visit: Payer: Self-pay

## 2020-04-24 NOTE — Progress Notes (Signed)
[id number] [address], [place] [address] Phone: [phone] Fax: [phone]  [id number] - [address], [place] [address] Phone: [phone] Fax: [phone]      Your procedure is scheduled on August 19  Report to [facility] Main Entrance "A" at 0530 A.M., and check in at the Admitting office.  Call this number if you have problems the morning of surgery:  [phone]  Call [phone] if you have any questions prior to your surgery date Monday-Friday 8am-4pm    Remember:  Do not eat or drink after midnight the night before your surgery     Take these medicines the morning of surgery with A SIP OF WATER  carvedilol (COREG) levothyroxine (SYNTHROID)   As of today, STOP taking any Aspirin (unless otherwise instructed by your surgeon) Aleve, Naproxen, Ibuprofen, Motrin, Advil, Goody's, BC's, all herbal medications, fish oil, and all vitamins.                      Do not wear jewelry, make up, or nail polish            Do not wear lotions, powders, perfumes, or deodorant.            Do not shave 48 hours prior to surgery.              Do not bring valuables to the hospital.            [facility] is not responsible for any belongings or valuables.  Do NOT Smoke (Tobacco/Vaping) or drink Alcohol 24 hours prior to your procedure If you use a CPAP at night, you may bring all equipment for your overnight stay.   Contacts, glasses, dentures or bridgework may not be worn into surgery.      For patients admitted to the hospital, discharge time will be determined by your treatment team.   Patients discharged the day of surgery will not be allowed to drive home, and someone needs to stay with them for 24 hours.    Special instructions:   [facility]- Preparing For Surgery  Before surgery, you can play an important role. Because skin is not  sterile, your skin needs to be as free of germs as possible. You can reduce the number of germs on your skin by washing with CHG (chlorahexidine gluconate) Soap before surgery.  CHG is an antiseptic cleaner which kills germs and bonds with the skin to continue killing germs even after washing.    Oral Hygiene is also important to reduce your risk of infection.  Remember - BRUSH YOUR TEETH THE MORNING OF SURGERY WITH YOUR REGULAR TOOTHPASTE  Please do not use if you have an allergy to CHG or antibacterial soaps. If your skin becomes reddened/irritated stop using the CHG.  Do not shave (including legs and underarms) for at least 48 hours prior to first CHG shower. It is OK to shave your face.  Please follow these instructions carefully.   1. Shower the NIGHT BEFORE SURGERY and the MORNING OF SURGERY with CHG Soap.   2. If you chose to wash your hair, wash your hair first as usual with your normal shampoo.  3. After you shampoo, rinse your hair and body thoroughly to remove the shampoo.  4. Use CHG as you would any other liquid soap. You can apply CHG directly  to the skin and wash gently with a scrungie or a clean washcloth.   5. Apply the CHG Soap to your body ONLY FROM THE NECK DOWN.  Do not use on open wounds or open sores. Avoid contact with your eyes, ears, mouth and genitals (private parts). Wash Face and genitals (private parts)  with your normal soap.   6. Wash thoroughly, paying special attention to the area where your surgery will be performed.  7. Thoroughly rinse your body with warm water from the neck down.  8. DO NOT shower/wash with your normal soap after using and rinsing off the CHG Soap.  9. Pat yourself dry with a CLEAN TOWEL.  10. Wear CLEAN PAJAMAS to bed the night before surgery  11. Place CLEAN SHEETS on your bed the night of your first shower and DO NOT SLEEP WITH PETS.   Day of Surgery: Wear Clean/Comfortable clothing the morning of surgery Do not apply any  deodorants/lotions.   Remember to brush your teeth WITH YOUR REGULAR TOOTHPASTE.   Please read over the following fact sheets that you were given.

## 2020-04-25 ENCOUNTER — Other Ambulatory Visit (HOSPITAL_COMMUNITY)
Admission: RE | Admit: 2020-04-25 | Discharge: 2020-04-25 | Disposition: A | Discharge status: Home / Self Care | Payer: BC Managed Care – PPO | Source: Ambulatory Visit | Attending: Neurosurgery | Admitting: Neurosurgery

## 2020-04-25 ENCOUNTER — Other Ambulatory Visit: Payer: Self-pay

## 2020-04-25 ENCOUNTER — Encounter (HOSPITAL_COMMUNITY): Payer: Self-pay

## 2020-04-25 ENCOUNTER — Encounter (HOSPITAL_COMMUNITY)
Admission: RE | Admit: 2020-04-25 | Discharge: 2020-04-25 | Disposition: A | Discharge status: Home / Self Care | Payer: BC Managed Care – PPO | Source: Ambulatory Visit | Attending: Neurosurgery | Admitting: Neurosurgery

## 2020-04-25 DIAGNOSIS — Z01812 Encounter for preprocedural laboratory examination: Secondary | ICD-10-CM | POA: Insufficient documentation

## 2020-04-25 DIAGNOSIS — Z20822 Contact with and (suspected) exposure to covid-19: Secondary | ICD-10-CM | POA: Insufficient documentation

## 2020-04-25 HISTORY — DX: Pneumonia, unspecified organism: J18.9

## 2020-04-25 HISTORY — DX: Other complications of anesthesia, initial encounter: T88.59XA

## 2020-04-25 LAB — CBC
HCT: 42 % (ref 36.0–46.0)
Hemoglobin: 13.8 g/dL (ref 12.0–15.0)
MCH: 29.3 pg (ref 26.0–34.0)
MCHC: 32.9 g/dL (ref 30.0–36.0)
MCV: 89.2 fL (ref 80.0–100.0)
Platelets: 319 10*3/uL (ref 150–400)
RBC: 4.71 MIL/uL (ref 3.87–5.11)
RDW: 14.6 % (ref 11.5–15.5)
WBC: 8.9 10*3/uL (ref 4.0–10.5)
nRBC: 0 % (ref 0.0–0.2)

## 2020-04-25 LAB — BASIC METABOLIC PANEL
Anion gap: 7 (ref 5–15)
BUN: 20 mg/dL (ref 8–23)
CO2: 26 mmol/L (ref 22–32)
Calcium: 10 mg/dL (ref 8.9–10.3)
Chloride: 106 mmol/L (ref 98–111)
Creatinine, Ser: 1.14 mg/dL — ABNORMAL HIGH (ref 0.44–1.00)
GFR calc Af Amer: 59 mL/min — ABNORMAL LOW (ref >60)
GFR calc non Af Amer: 51 mL/min — ABNORMAL LOW (ref >60)
Glucose, Bld: 99 mg/dL (ref 70–99)
Potassium: 4.4 mmol/L (ref 3.5–5.1)
Sodium: 139 mmol/L (ref 135–145)

## 2020-04-25 LAB — SURGICAL PCR SCREEN
MRSA, PCR: NEGATIVE
Staphylococcus aureus: NEGATIVE

## 2020-04-25 LAB — SARS CORONAVIRUS 2 (TAT 6-24 HRS): SARS Coronavirus 2: NEGATIVE

## 2020-04-25 LAB — TYPE AND SCREEN
ABO/RH(D): O NEG
Antibody Screen: NEGATIVE

## 2020-04-25 NOTE — Progress Notes (Signed)
PCP - Dr. Lucianne Lei in [place] - Dr. Tammi Sou in [place]  Chest x-ray - Not indicated EKG - 03/20/20 Stress Test - Yes 2019 ECHO - Yes 2019 Cardiac Cath - Denies  No OSA  DM - Denies  COVID TEST- 04/25/20  Anesthesia review: Yes Cardiac history   Patient denies shortness of breath, fever, cough and chest pain at PAT appointment   All instructions explained to the patient, with a verbal understanding of the material. Patient agrees to go over the instructions while at home for a better understanding. Patient also instructed to self quarantine after being tested for COVID-19. The opportunity to ask questions was provided.

## 2020-04-25 NOTE — H&P (Signed)
Patient ID:   [id number] Patient: Tammy Booth  Date of Birth: 1956/08/20 Visit Type: Office Visit   Date: 12/01/2019 02:15 PM Provider: Marchia Meiers. Vertell Limber MD   This 64 year old female presents for MRI Review/back pain.  HISTORY OF PRESENT ILLNESS: 1.  MRI Review/back pain  Patient returns to review her MRI  Lumbar MRI read demonstrates severe left foraminal stenosis L5-S1.  She also has degeneration and scoliosis at the T12-L1 level on the right.  I have reviewed her scoliosis x-rays and discussed treatment options.  She has exhausted relief with L5 nerve blocks and is now having constant and unrelenting pain due to this nerve compression.  She has weakness in an L5 distribution with EHL and dorsiflexion weakness at 4/5 on the left with a positive seated straight leg raise at this level  I have recommended after lengthy discussion with the patient, ALIF at L5-S1 with extension of posterior fusion to the pelvis and also to the level of T10  Patient has a 21 degree Cobb angle at T 12 - L 1 and +13 degrees lumbar mismatch.    She describes her pain level as 5/10 consistently.    She is working on stopping smoking.      Medical/Surgical/Interim History Reviewed, no change.  Last detailed document date:10/15/2017.     PAST MEDICAL HISTORY, SURGICAL HISTORY, FAMILY HISTORY, SOCIAL HISTORY AND REVIEW OF SYSTEMS I have reviewed the patient's past medical, surgical, family and social history as well as the comprehensive review of systems as included on the [facility] history form dated 07/22/2019, which I have signed.  Family History: Reviewed, no changes.  Last detailed document date:10/15/2017.   Social History: Reviewed, no changes. Last detailed document date: 10/15/2017.    MEDICATIONS: (added, continued or stopped this visit) Started Medication Directions Instruction Stopped  Aspirin Low Dose 81 mg tablet,delayed  release     Calcium 500  500 mg calcium (1,250 mg) tablet     carvedilol 3.125 mg tablet     FiberCon 625 mg tablet     ibuprofen 200 mg tablet     levothyroxine 112 mcg tablet take 1 tablet by oral route  every day    Vitamin D3  ORAL       ALLERGIES: Ingredient Reaction Medication Name Comment NO KNOWN ALLERGIES    No known allergies. Reviewed, no changes.    PHYSICAL EXAM:  Vitals Date Temp F BP Pulse Ht In Wt Lb BMI BSA Pain Score 12/01/2019 97.3 132/82 71 65 136.4 22.7  5/10     IMPRESSION:  Progressive worsening of scoliosis and left L5 nerve root compression  PLAN: Proceed with ALIF L5-S1 with posterior fixation T10 to pelvis with exploration of prior fusion L1 through L5 levels with Vascular Surgery and intraoperative CT navigation with Airo.  Orders: Diagnostic Procedures: Assessment Procedure M54.16 Scoliosis- AP/Lat Instruction(s)/Education: Assessment Instruction I10 Lifestyle education Miscellaneous: Assessment  M41.26 TLSO Brace (Drawstring)  Completed Orders (this encounter) Order Details Reason Side Interpretation Result Initial Treatment Date Region Lifestyle education Patient will follow up with Primary Care Physician.        Assessment/Plan  # Detail Type Description  1. Assessment Low back pain, unspecified back pain laterality, with sciatica presence unspecified (M54.5).     2. 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  Plan Orders TLSO Brace (Drawstring). Clinical information/comments: COPC - given to patient.     3. Assessment Radiculopathy, lumbar region (M54.16).     4.  Assessment Degenerative lumbar spinal stenosis (M48.061).     5. Assessment Essential (primary) hypertension (I10).       Pain Management Plan Pain Scale: 5/10. Method: Numeric Pain Intensity Scale. Location: back. Onset: 06/14/2017. Duration:  varies. Quality: discomforting. Pain management follow-up plan of care: Patient will continue medication management..              Provider:  Marchia Meiers. Vertell Limber MD  12/06/2019 08:40 AM    Dictation edited by: Marchia Meiers. Vertell Limber    CC Providers: Lucianne Lei [address] [place]  [address]   Erline Levine MD  [address] [place], [place] [address]               Electronically signed by Marchia Meiers. Vertell Limber MD on 12/06/2019 08:40 AM

## 2020-04-26 NOTE — Anesthesia Preprocedure Evaluation (Addendum)
Anesthesia Evaluation  Patient identified by MRN, date of birth, ID band Patient awake    Reviewed: Allergy & Precautions, NPO status , Patient's Chart, lab work & pertinent test results, reviewed documented beta blocker date and time   History of Anesthesia Complications (+) PROLONGED EMERGENCE and history of anesthetic complications  Airway Mallampati: I  TM Distance: >3 FB Neck ROM: Full    Dental  (+) Dental Advisory Given, Loose,    Pulmonary Current Smoker and Patient abstained from smoking.,    Pulmonary exam normal        Cardiovascular hypertension, Pt. on medications and Pt. on home beta blockers Normal cardiovascular exam     Neuro/Psych  Neuromuscular disease (radiculopathy) negative psych ROS   GI/Hepatic negative GI ROS, Neg liver ROS,   Endo/Other  Hypothyroidism   Renal/GU negative Renal ROS     Musculoskeletal  (+) Arthritis ,   Abdominal   Peds  Hematology negative hematology ROS (+)   Anesthesia Other Findings Covid test negative See PAT note regarding cardiac clearance   Reproductive/Obstetrics                           Anesthesia Physical Anesthesia Plan  ASA: II  Anesthesia Plan: General   Post-op Pain Management:    Induction: Intravenous  PONV Risk Score and Plan: 3 and Treatment may vary due to age or medical condition, Ondansetron, Dexamethasone and Midazolam  Airway Management Planned: Oral ETT  Additional Equipment: Arterial line  Intra-op Plan:   Post-operative Plan: Extubation in OR  Informed Consent: I have reviewed the patients History and Physical, chart, labs and discussed the procedure including the risks, benefits and alternatives for the proposed anesthesia with the patient or authorized representative who has indicated his/her understanding and acceptance.     Dental advisory given  Plan Discussed with: CRNA and  Anesthesiologist  Anesthesia Plan Comments:      Anesthesia Quick Evaluation

## 2020-04-27 ENCOUNTER — Inpatient Hospital Stay (HOSPITAL_COMMUNITY): Payer: BC Managed Care – PPO

## 2020-04-27 ENCOUNTER — Inpatient Hospital Stay (HOSPITAL_COMMUNITY)
Admission: RE | Admit: 2020-04-27 | Discharge: 2020-05-03 | DRG: 458 | Disposition: A | Discharge status: Skilled Nursing Facility | Payer: BC Managed Care – PPO | Attending: Neurosurgery | Admitting: Neurosurgery

## 2020-04-27 ENCOUNTER — Other Ambulatory Visit: Payer: Self-pay

## 2020-04-27 ENCOUNTER — Encounter (HOSPITAL_COMMUNITY): Payer: Self-pay | Admitting: Neurosurgery

## 2020-04-27 ENCOUNTER — Inpatient Hospital Stay (HOSPITAL_COMMUNITY): Payer: BC Managed Care – PPO | Admitting: Anesthesiology

## 2020-04-27 ENCOUNTER — Inpatient Hospital Stay (HOSPITAL_COMMUNITY): Payer: BC Managed Care – PPO | Admitting: Physician Assistant

## 2020-04-27 ENCOUNTER — Encounter (HOSPITAL_COMMUNITY)
Admission: RE | Disposition: A | Discharge status: Skilled Nursing Facility | Payer: Self-pay | Source: Home / Self Care | Attending: Neurosurgery

## 2020-04-27 DIAGNOSIS — M5116 Intervertebral disc disorders with radiculopathy, lumbar region: Secondary | ICD-10-CM

## 2020-04-27 DIAGNOSIS — Z419 Encounter for procedure for purposes other than remedying health state, unspecified: Secondary | ICD-10-CM

## 2020-04-27 DIAGNOSIS — Z809 Family history of malignant neoplasm, unspecified: Secondary | ICD-10-CM | POA: Diagnosis not present

## 2020-04-27 DIAGNOSIS — T4145XA Adverse effect of unspecified anesthetic, initial encounter: Secondary | ICD-10-CM | POA: Diagnosis not present

## 2020-04-27 DIAGNOSIS — M4125 Other idiopathic scoliosis, thoracolumbar region: Secondary | ICD-10-CM | POA: Diagnosis present

## 2020-04-27 DIAGNOSIS — M48061 Spinal stenosis, lumbar region without neurogenic claudication: Secondary | ICD-10-CM | POA: Diagnosis present

## 2020-04-27 DIAGNOSIS — M549 Dorsalgia, unspecified: Secondary | ICD-10-CM | POA: Diagnosis present

## 2020-04-27 DIAGNOSIS — F17210 Nicotine dependence, cigarettes, uncomplicated: Secondary | ICD-10-CM | POA: Diagnosis present

## 2020-04-27 DIAGNOSIS — Z8249 Family history of ischemic heart disease and other diseases of the circulatory system: Secondary | ICD-10-CM | POA: Diagnosis not present

## 2020-04-27 DIAGNOSIS — Z7982 Long term (current) use of aspirin: Secondary | ICD-10-CM | POA: Diagnosis not present

## 2020-04-27 DIAGNOSIS — M5416 Radiculopathy, lumbar region: Secondary | ICD-10-CM | POA: Diagnosis present

## 2020-04-27 DIAGNOSIS — E039 Hypothyroidism, unspecified: Secondary | ICD-10-CM | POA: Diagnosis present

## 2020-04-27 DIAGNOSIS — Z79899 Other long term (current) drug therapy: Secondary | ICD-10-CM | POA: Diagnosis not present

## 2020-04-27 DIAGNOSIS — M4185 Other forms of scoliosis, thoracolumbar region: Secondary | ICD-10-CM | POA: Diagnosis not present

## 2020-04-27 DIAGNOSIS — Z20822 Contact with and (suspected) exposure to covid-19: Secondary | ICD-10-CM | POA: Diagnosis present

## 2020-04-27 DIAGNOSIS — M4726 Other spondylosis with radiculopathy, lumbar region: Secondary | ICD-10-CM | POA: Diagnosis not present

## 2020-04-27 DIAGNOSIS — R110 Nausea: Secondary | ICD-10-CM | POA: Diagnosis not present

## 2020-04-27 DIAGNOSIS — Z833 Family history of diabetes mellitus: Secondary | ICD-10-CM

## 2020-04-27 DIAGNOSIS — M4807 Spinal stenosis, lumbosacral region: Secondary | ICD-10-CM | POA: Diagnosis present

## 2020-04-27 DIAGNOSIS — M4126 Other idiopathic scoliosis, lumbar region: Secondary | ICD-10-CM | POA: Diagnosis present

## 2020-04-27 DIAGNOSIS — I10 Essential (primary) hypertension: Secondary | ICD-10-CM | POA: Diagnosis present

## 2020-04-27 HISTORY — PX: APPLICATION OF INTRAOPERATIVE CT SCAN: SHX6668

## 2020-04-27 HISTORY — PX: ABDOMINAL EXPOSURE: SHX5708

## 2020-04-27 HISTORY — PX: ANTERIOR LUMBAR FUSION: SHX1170

## 2020-04-27 SURGERY — ANTERIOR LUMBAR FUSION 1 LEVEL
Anesthesia: General | Site: Back

## 2020-04-27 MED ORDER — MENTHOL 3 MG MT LOZG: 1.0000 | LOZENGE | OROMUCOSAL | Status: DC | PRN

## 2020-04-27 MED ORDER — ROCURONIUM BROMIDE 10 MG/ML (PF) SYRINGE
PREFILLED_SYRINGE | INTRAVENOUS | Status: AC
  Filled 2020-04-27: qty 10

## 2020-04-27 MED ORDER — BUPIVACAINE HCL (PF) 0.5 % IJ SOLN
INTRAMUSCULAR | Status: AC
  Filled 2020-04-27: qty 30

## 2020-04-27 MED ORDER — 0.9 % SODIUM CHLORIDE (POUR BTL) OPTIME
TOPICAL | Status: DC | PRN
  Administered 2020-04-27: 1000 mL

## 2020-04-27 MED ORDER — BUPIVACAINE LIPOSOME 1.3 % IJ SUSP
INTRAMUSCULAR | Status: DC | PRN
  Administered 2020-04-27: 20 mL

## 2020-04-27 MED ORDER — BUPIVACAINE HCL (PF) 0.5 % IJ SOLN
INTRAMUSCULAR | Status: DC | PRN
  Administered 2020-04-27: 10 mL

## 2020-04-27 MED ORDER — LIDOCAINE-EPINEPHRINE 1 %-1:100000 IJ SOLN
INTRAMUSCULAR | Status: AC
  Filled 2020-04-27: qty 1

## 2020-04-27 MED ORDER — PHENOL 1.4 % MT LIQD: 1.0000 | OROMUCOSAL | Status: DC | PRN

## 2020-04-27 MED ORDER — SODIUM CHLORIDE (PF) 0.9 % IJ SOLN
INTRAMUSCULAR | Status: DC | PRN
  Administered 2020-04-27: 20 mL via INTRAVENOUS

## 2020-04-27 MED ORDER — CHLORHEXIDINE GLUCONATE 0.12 % MT SOLN
15.0000 mL | Freq: Once | OROMUCOSAL | Status: AC
  Administered 2020-04-27: 15 mL via OROMUCOSAL
  Filled 2020-04-27: qty 15

## 2020-04-27 MED ORDER — FENTANYL CITRATE (PF) 100 MCG/2ML IJ SOLN
25.0000 ug | INTRAMUSCULAR | Status: DC | PRN
  Administered 2020-04-27: 25 ug via INTRAVENOUS

## 2020-04-27 MED ORDER — DEXAMETHASONE SODIUM PHOSPHATE 10 MG/ML IJ SOLN
INTRAMUSCULAR | Status: AC
  Filled 2020-04-27: qty 1

## 2020-04-27 MED ORDER — PHENYLEPHRINE HCL-NACL 10-0.9 MG/250ML-% IV SOLN
INTRAVENOUS | Status: DC | PRN
  Administered 2020-04-27: 25 ug/min via INTRAVENOUS

## 2020-04-27 MED ORDER — CARVEDILOL 3.125 MG PO TABS
3.1250 mg | ORAL_TABLET | Freq: Two times a day (BID) | ORAL | Status: DC
  Administered 2020-04-28 – 2020-05-03 (×4): 3.125 mg via ORAL
  Filled 2020-04-27 (×7): qty 1

## 2020-04-27 MED ORDER — SODIUM CHLORIDE 0.9 % IV SOLN
250.0000 mL | INTRAVENOUS | Status: DC
  Administered 2020-04-27: 250 mL via INTRAVENOUS

## 2020-04-27 MED ORDER — DEXAMETHASONE SODIUM PHOSPHATE 10 MG/ML IJ SOLN
INTRAMUSCULAR | Status: DC | PRN
  Administered 2020-04-27: 10 mg via INTRAVENOUS

## 2020-04-27 MED ORDER — MIDAZOLAM HCL 2 MG/2ML IJ SOLN
INTRAMUSCULAR | Status: AC
  Filled 2020-04-27: qty 2

## 2020-04-27 MED ORDER — ORAL CARE MOUTH RINSE: 15.0000 mL | Freq: Once | OROMUCOSAL | Status: AC

## 2020-04-27 MED ORDER — PROPOFOL 10 MG/ML IV BOLUS
INTRAVENOUS | Status: AC
  Filled 2020-04-27: qty 20

## 2020-04-27 MED ORDER — LIDOCAINE-EPINEPHRINE 1 %-1:100000 IJ SOLN
INTRAMUSCULAR | Status: DC | PRN
  Administered 2020-04-27: 10 mL

## 2020-04-27 MED ORDER — SODIUM CHLORIDE 0.9% FLUSH: 3.0000 mL | INTRAVENOUS | Status: DC | PRN

## 2020-04-27 MED ORDER — ACETAMINOPHEN 650 MG RE SUPP: 650.0000 mg | RECTAL | Status: DC | PRN

## 2020-04-27 MED ORDER — ADULT MULTIVITAMIN W/MINERALS CH
1.0000 | ORAL_TABLET | Freq: Every day | ORAL | Status: DC
  Administered 2020-04-27 – 2020-05-03 (×7): 1 via ORAL
  Filled 2020-04-27 (×7): qty 1

## 2020-04-27 MED ORDER — HYDROXYZINE HCL 25 MG PO TABS: 25.0000 mg | ORAL_TABLET | Freq: Every evening | ORAL | Status: DC | PRN

## 2020-04-27 MED ORDER — CHLORHEXIDINE GLUCONATE 4 % EX LIQD: 60.0000 mL | Freq: Once | CUTANEOUS | Status: DC

## 2020-04-27 MED ORDER — PHENYLEPHRINE 40 MCG/ML (10ML) SYRINGE FOR IV PUSH (FOR BLOOD PRESSURE SUPPORT)
PREFILLED_SYRINGE | INTRAVENOUS | Status: AC
  Filled 2020-04-27: qty 10

## 2020-04-27 MED ORDER — OXYCODONE HCL 5 MG PO TABS: 5.0000 mg | ORAL_TABLET | Freq: Once | ORAL | Status: DC | PRN

## 2020-04-27 MED ORDER — METHOCARBAMOL 500 MG PO TABS
500.0000 mg | ORAL_TABLET | Freq: Four times a day (QID) | ORAL | Status: DC | PRN
  Administered 2020-04-28 – 2020-04-29 (×4): 500 mg via ORAL
  Filled 2020-04-27 (×4): qty 1

## 2020-04-27 MED ORDER — ZOLPIDEM TARTRATE 5 MG PO TABS: 5.0000 mg | ORAL_TABLET | Freq: Every evening | ORAL | Status: DC | PRN

## 2020-04-27 MED ORDER — CHLORHEXIDINE GLUCONATE CLOTH 2 % EX PADS: 6.0000 | MEDICATED_PAD | Freq: Once | CUTANEOUS | Status: DC

## 2020-04-27 MED ORDER — THROMBIN 5000 UNITS EX SOLR
OROMUCOSAL | Status: DC | PRN
  Administered 2020-04-27: 5 mL via TOPICAL

## 2020-04-27 MED ORDER — SODIUM CHLORIDE 0.9% FLUSH
3.0000 mL | Freq: Two times a day (BID) | INTRAVENOUS | Status: DC
  Administered 2020-04-27 – 2020-05-03 (×12): 3 mL via INTRAVENOUS

## 2020-04-27 MED ORDER — LACTATED RINGERS IV SOLN: INTRAVENOUS | Status: DC | PRN

## 2020-04-27 MED ORDER — PROPOFOL 500 MG/50ML IV EMUL
INTRAVENOUS | Status: DC | PRN
  Administered 2020-04-27: 25 ug/kg/min via INTRAVENOUS

## 2020-04-27 MED ORDER — ACETAMINOPHEN 325 MG PO TABS: 650.0000 mg | ORAL_TABLET | ORAL | Status: DC | PRN

## 2020-04-27 MED ORDER — ALBUMIN HUMAN 5 % IV SOLN: INTRAVENOUS | Status: DC | PRN

## 2020-04-27 MED ORDER — BUPIVACAINE LIPOSOME 1.3 % IJ SUSP
20.0000 mL | Freq: Once | INTRAMUSCULAR | Status: DC
  Filled 2020-04-27: qty 20

## 2020-04-27 MED ORDER — FENTANYL CITRATE (PF) 250 MCG/5ML IJ SOLN
INTRAMUSCULAR | Status: AC
  Filled 2020-04-27: qty 5

## 2020-04-27 MED ORDER — ONDANSETRON HCL 4 MG/2ML IJ SOLN
INTRAMUSCULAR | Status: DC | PRN
  Administered 2020-04-27: 4 mg via INTRAVENOUS

## 2020-04-27 MED ORDER — LIDOCAINE 2% (20 MG/ML) 5 ML SYRINGE
INTRAMUSCULAR | Status: AC
  Filled 2020-04-27: qty 5

## 2020-04-27 MED ORDER — ACETAMINOPHEN 10 MG/ML IV SOLN
INTRAVENOUS | Status: DC | PRN
  Administered 2020-04-27: 1000 mg via INTRAVENOUS

## 2020-04-27 MED ORDER — CEFAZOLIN SODIUM 1 G IJ SOLR
INTRAMUSCULAR | Status: AC
  Filled 2020-04-27: qty 10

## 2020-04-27 MED ORDER — BISACODYL 10 MG RE SUPP: 10.0000 mg | Freq: Every day | RECTAL | Status: DC | PRN

## 2020-04-27 MED ORDER — CEFAZOLIN SODIUM-DEXTROSE 2-4 GM/100ML-% IV SOLN
2.0000 g | Freq: Two times a day (BID) | INTRAVENOUS | Status: AC
  Administered 2020-04-27 – 2020-04-28 (×2): 2 g via INTRAVENOUS
  Filled 2020-04-27 (×2): qty 100

## 2020-04-27 MED ORDER — MIDAZOLAM HCL 5 MG/5ML IJ SOLN
INTRAMUSCULAR | Status: DC | PRN
  Administered 2020-04-27: 2 mg via INTRAVENOUS

## 2020-04-27 MED ORDER — OXYCODONE HCL 5 MG PO TABS
5.0000 mg | ORAL_TABLET | ORAL | Status: DC | PRN
  Administered 2020-04-28 – 2020-05-03 (×8): 5 mg via ORAL
  Filled 2020-04-27 (×8): qty 1

## 2020-04-27 MED ORDER — DOCUSATE SODIUM 100 MG PO CAPS
100.0000 mg | ORAL_CAPSULE | Freq: Two times a day (BID) | ORAL | Status: DC
  Administered 2020-04-28 – 2020-05-03 (×11): 100 mg via ORAL
  Filled 2020-04-27 (×11): qty 1

## 2020-04-27 MED ORDER — METHOCARBAMOL 1000 MG/10ML IJ SOLN
500.0000 mg | Freq: Four times a day (QID) | INTRAVENOUS | Status: DC | PRN
  Filled 2020-04-27: qty 5

## 2020-04-27 MED ORDER — KCL IN DEXTROSE-NACL 20-5-0.45 MEQ/L-%-% IV SOLN
INTRAVENOUS | Status: DC
  Filled 2020-04-27: qty 1000

## 2020-04-27 MED ORDER — FLEET ENEMA 7-19 GM/118ML RE ENEM: 1.0000 | ENEMA | Freq: Once | RECTAL | Status: DC | PRN

## 2020-04-27 MED ORDER — LACTATED RINGERS IV SOLN: INTRAVENOUS | Status: DC

## 2020-04-27 MED ORDER — ONDANSETRON HCL 4 MG/2ML IJ SOLN
INTRAMUSCULAR | Status: AC
  Filled 2020-04-27: qty 2

## 2020-04-27 MED ORDER — PHENYLEPHRINE 40 MCG/ML (10ML) SYRINGE FOR IV PUSH (FOR BLOOD PRESSURE SUPPORT)
PREFILLED_SYRINGE | INTRAVENOUS | Status: DC | PRN
  Administered 2020-04-27: 120 ug via INTRAVENOUS
  Administered 2020-04-27 (×3): 80 ug via INTRAVENOUS
  Administered 2020-04-27: 40 ug via INTRAVENOUS

## 2020-04-27 MED ORDER — ROCURONIUM BROMIDE 10 MG/ML (PF) SYRINGE
PREFILLED_SYRINGE | INTRAVENOUS | Status: DC | PRN
  Administered 2020-04-27: 70 mg via INTRAVENOUS
  Administered 2020-04-27: 20 mg via INTRAVENOUS
  Administered 2020-04-27: 10 mg via INTRAVENOUS
  Administered 2020-04-27: 20 mg via INTRAVENOUS
  Administered 2020-04-27 (×2): 10 mg via INTRAVENOUS

## 2020-04-27 MED ORDER — LEVOTHYROXINE SODIUM 25 MCG PO TABS
125.0000 ug | ORAL_TABLET | Freq: Every day | ORAL | Status: DC
  Administered 2020-04-28 – 2020-05-03 (×6): 125 ug via ORAL
  Filled 2020-04-27 (×6): qty 1

## 2020-04-27 MED ORDER — PROPOFOL 10 MG/ML IV BOLUS
INTRAVENOUS | Status: DC | PRN
  Administered 2020-04-27: 120 mg via INTRAVENOUS

## 2020-04-27 MED ORDER — THROMBIN 5000 UNITS EX SOLR
CUTANEOUS | Status: AC
  Filled 2020-04-27: qty 15000

## 2020-04-27 MED ORDER — OXYCODONE HCL 5 MG/5ML PO SOLN: 5.0000 mg | Freq: Once | ORAL | Status: DC | PRN

## 2020-04-27 MED ORDER — THROMBIN 20000 UNITS EX SOLR
CUTANEOUS | Status: DC | PRN
  Administered 2020-04-27: 20 mL via TOPICAL

## 2020-04-27 MED ORDER — POLYETHYLENE GLYCOL 3350 17 G PO PACK: 17.0000 g | PACK | Freq: Every day | ORAL | Status: DC | PRN

## 2020-04-27 MED ORDER — ALUM & MAG HYDROXIDE-SIMETH 200-200-20 MG/5ML PO SUSP: 30.0000 mL | Freq: Four times a day (QID) | ORAL | Status: DC | PRN

## 2020-04-27 MED ORDER — HYDROMORPHONE HCL 1 MG/ML IJ SOLN
0.5000 mg | INTRAMUSCULAR | Status: DC | PRN
  Administered 2020-04-27 – 2020-04-28 (×2): 0.5 mg via INTRAVENOUS
  Filled 2020-04-27 (×2): qty 1

## 2020-04-27 MED ORDER — CEFAZOLIN SODIUM-DEXTROSE 2-3 GM-%(50ML) IV SOLR: INTRAVENOUS | Status: DC | PRN

## 2020-04-27 MED ORDER — ACETAMINOPHEN 10 MG/ML IV SOLN
INTRAVENOUS | Status: AC
  Filled 2020-04-27: qty 100

## 2020-04-27 MED ORDER — PANTOPRAZOLE SODIUM 40 MG IV SOLR
40.0000 mg | Freq: Every day | INTRAVENOUS | Status: DC
  Administered 2020-04-27: 40 mg via INTRAVENOUS
  Filled 2020-04-27: qty 40

## 2020-04-27 MED ORDER — THROMBIN 20000 UNITS EX SOLR
CUTANEOUS | Status: AC
  Filled 2020-04-27: qty 20000

## 2020-04-27 MED ORDER — HYDROCODONE-ACETAMINOPHEN 5-325 MG PO TABS
2.0000 | ORAL_TABLET | ORAL | Status: DC | PRN
  Administered 2020-04-27 – 2020-05-03 (×20): 2 via ORAL
  Filled 2020-04-27 (×20): qty 2

## 2020-04-27 MED ORDER — ONDANSETRON HCL 4 MG/2ML IJ SOLN: 4.0000 mg | Freq: Once | INTRAMUSCULAR | Status: DC | PRN

## 2020-04-27 MED ORDER — ONDANSETRON HCL 4 MG/2ML IJ SOLN
4.0000 mg | Freq: Four times a day (QID) | INTRAMUSCULAR | Status: DC | PRN
  Administered 2020-04-27: 4 mg via INTRAVENOUS
  Filled 2020-04-27 (×3): qty 2

## 2020-04-27 MED ORDER — ONDANSETRON HCL 4 MG PO TABS
4.0000 mg | ORAL_TABLET | Freq: Four times a day (QID) | ORAL | Status: DC | PRN
  Administered 2020-05-01: 4 mg via ORAL
  Filled 2020-04-27 (×2): qty 1

## 2020-04-27 MED ORDER — EPHEDRINE 5 MG/ML INJ
INTRAVENOUS | Status: AC
  Filled 2020-04-27: qty 10

## 2020-04-27 MED ORDER — SUGAMMADEX SODIUM 200 MG/2ML IV SOLN
INTRAVENOUS | Status: DC | PRN
  Administered 2020-04-27: 150 mg via INTRAVENOUS

## 2020-04-27 MED ORDER — FENTANYL CITRATE (PF) 100 MCG/2ML IJ SOLN
INTRAMUSCULAR | Status: AC
  Administered 2020-04-27: 25 ug
  Filled 2020-04-27: qty 2

## 2020-04-27 MED ORDER — FENTANYL CITRATE (PF) 100 MCG/2ML IJ SOLN
INTRAMUSCULAR | Status: DC | PRN
Start: 2020-04-27 — End: 2020-04-27
  Administered 2020-04-27: 50 ug via INTRAVENOUS
  Administered 2020-04-27: 100 ug via INTRAVENOUS
  Administered 2020-04-27 (×3): 50 ug via INTRAVENOUS

## 2020-04-27 MED ORDER — CEFAZOLIN SODIUM-DEXTROSE 2-4 GM/100ML-% IV SOLN
2.0000 g | INTRAVENOUS | Status: AC
  Administered 2020-04-27 (×2): 2 g via INTRAVENOUS
  Filled 2020-04-27: qty 100

## 2020-04-27 MED ORDER — LIDOCAINE 2% (20 MG/ML) 5 ML SYRINGE
INTRAMUSCULAR | Status: DC | PRN
  Administered 2020-04-27: 40 mg via INTRAVENOUS

## 2020-04-27 SURGICAL SUPPLY — 136 items
APPLIER CLIP 11 MED OPEN (CLIP) ×4
BASKET BONE COLLECTION (BASKET) ×4 IMPLANT
BLADE CLIPPER SURG (BLADE) ×4 IMPLANT
BOLT BASE TI 5X17.5 VARIABLE (Bolt) ×8 IMPLANT
BOLT BASE TI 5X20 VARIABLE (Bolt) ×4 IMPLANT
BONE CANC CHIPS 40CC CAN1/2 (Bone Implant) ×8 IMPLANT
BUR BARREL STRAIGHT FLUTE 4.0 (BURR) ×4 IMPLANT
BUR MATCHSTICK NEURO 3.0 LAGG (BURR) ×4 IMPLANT
BUR PRECISION FLUTE 5.0 (BURR) ×4 IMPLANT
CANISTER SUCT 3000ML PPV (MISCELLANEOUS) ×8 IMPLANT
CARTRIDGE OIL MAESTRO DRILL (MISCELLANEOUS) ×4 IMPLANT
CHIPS CANC BONE 40CC CAN1/2 (Bone Implant) ×4 IMPLANT
CLIP APPLIE 11 MED OPEN (CLIP) ×2 IMPLANT
CLIP VESOCCLUDE MED 24/CT (CLIP) ×4 IMPLANT
CLIP VESOCCLUDE SM WIDE 24/CT (CLIP) ×4 IMPLANT
CNTNR URN SCR LID CUP LEK RST (MISCELLANEOUS) ×2 IMPLANT
CONT SPEC 4OZ STRL OR WHT (MISCELLANEOUS) ×2
COVER BACK TABLE 60X90IN (DRAPES) ×4 IMPLANT
COVER WAND RF STERILE (DRAPES) ×8 IMPLANT
DECANTER SPIKE VIAL GLASS SM (MISCELLANEOUS) ×8 IMPLANT
DERMABOND ADVANCED (GAUZE/BANDAGES/DRESSINGS) ×4
DERMABOND ADVANCED .7 DNX12 (GAUZE/BANDAGES/DRESSINGS) ×4 IMPLANT
DIFFUSER DRILL AIR PNEUMATIC (MISCELLANEOUS) ×8 IMPLANT
DIGITIZER BENDINI (MISCELLANEOUS) ×4 IMPLANT
DRAPE C-ARM 42X72 X-RAY (DRAPES) ×4 IMPLANT
DRAPE C-ARMOR (DRAPES) ×4 IMPLANT
DRAPE INCISE IOBAN 66X45 STRL (DRAPES) ×4 IMPLANT
DRAPE LAPAROTOMY 100X72X124 (DRAPES) ×8 IMPLANT
DRAPE SCAN PATIENT (DRAPES) ×8 IMPLANT
DRAPE SURG 17X23 STRL (DRAPES) ×4 IMPLANT
DRSG OPSITE POSTOP 4X10 (GAUZE/BANDAGES/DRESSINGS) ×4 IMPLANT
DRSG OPSITE POSTOP 4X6 (GAUZE/BANDAGES/DRESSINGS) ×4 IMPLANT
DRSG OPSITE POSTOP 4X8 (GAUZE/BANDAGES/DRESSINGS) ×4 IMPLANT
DURAPREP 26ML APPLICATOR (WOUND CARE) ×8 IMPLANT
ELECT BLADE 4.0 EZ CLEAN MEGAD (MISCELLANEOUS) ×8
ELECT REM PT RETURN 9FT ADLT (ELECTROSURGICAL) ×8
ELECTRODE BLDE 4.0 EZ CLN MEGD (MISCELLANEOUS) ×4 IMPLANT
ELECTRODE REM PT RTRN 9FT ADLT (ELECTROSURGICAL) ×4 IMPLANT
EVACUATOR 1/8 PVC DRAIN (DRAIN) IMPLANT
GAUZE 4X4 16PLY RFD (DISPOSABLE) IMPLANT
GAUZE SPONGE 4X4 12PLY STRL (GAUZE/BANDAGES/DRESSINGS) IMPLANT
GLOVE BIO SURGEON STRL SZ8 (GLOVE) ×12 IMPLANT
GLOVE BIOGEL PI IND STRL 6.5 (GLOVE) ×10 IMPLANT
GLOVE BIOGEL PI IND STRL 7.5 (GLOVE) ×2 IMPLANT
GLOVE BIOGEL PI IND STRL 8 (GLOVE) ×8 IMPLANT
GLOVE BIOGEL PI IND STRL 8.5 (GLOVE) ×6 IMPLANT
GLOVE BIOGEL PI INDICATOR 6.5 (GLOVE) ×10
GLOVE BIOGEL PI INDICATOR 7.5 (GLOVE) ×2
GLOVE BIOGEL PI INDICATOR 8 (GLOVE) ×8
GLOVE BIOGEL PI INDICATOR 8.5 (GLOVE) ×6
GLOVE ECLIPSE 6.5 STRL STRAW (GLOVE) ×8 IMPLANT
GLOVE ECLIPSE 8.0 STRL XLNG CF (GLOVE) ×16 IMPLANT
GLOVE EXAM NITRILE XL STR (GLOVE) IMPLANT
GLOVE SURG SS PI 7.5 STRL IVOR (GLOVE) ×4 IMPLANT
GOWN STRL REUS W/ TWL LRG LVL3 (GOWN DISPOSABLE) ×4 IMPLANT
GOWN STRL REUS W/ TWL XL LVL3 (GOWN DISPOSABLE) ×8 IMPLANT
GOWN STRL REUS W/TWL 2XL LVL3 (GOWN DISPOSABLE) ×12 IMPLANT
GOWN STRL REUS W/TWL LRG LVL3 (GOWN DISPOSABLE) ×4
GOWN STRL REUS W/TWL XL LVL3 (GOWN DISPOSABLE) ×8
HEMOSTAT POWDER SURGIFOAM 1G (HEMOSTASIS) ×4 IMPLANT
IMPL BASE TI 6X38X28MM 15DE (Neuro Prosthesis/Implant) ×2 IMPLANT
IMPLANT BASE TI 6X38X28MM 15DE (Neuro Prosthesis/Implant) ×4 IMPLANT
INSERT FOGARTY 61MM (MISCELLANEOUS) IMPLANT
INSERT FOGARTY SM (MISCELLANEOUS) IMPLANT
KIT BASIN OR (CUSTOM PROCEDURE TRAY) ×8 IMPLANT
KIT INFUSE SMALL (Orthopedic Implant) ×4 IMPLANT
KIT INFUSE XX SMALL 0.7CC (Orthopedic Implant) ×4 IMPLANT
KIT POSITION SURG JACKSON T1 (MISCELLANEOUS) ×4 IMPLANT
KIT TURNOVER KIT B (KITS) ×8 IMPLANT
LOOP VESSEL MAXI BLUE (MISCELLANEOUS) IMPLANT
LOOP VESSEL MINI RED (MISCELLANEOUS) IMPLANT
MARKER SPHERE PSV REFLC 13MM (MARKER) ×28 IMPLANT
MILL MEDIUM DISP (BLADE) ×4 IMPLANT
MODULE POWER NUVASIVE (MISCELLANEOUS) ×2 IMPLANT
NEEDLE HYPO 21X1.5 SAFETY (NEEDLE) ×4 IMPLANT
NEEDLE HYPO 25X1 1.5 SAFETY (NEEDLE) ×4 IMPLANT
NEEDLE SPNL 18GX3.5 QUINCKE PK (NEEDLE) ×4 IMPLANT
NS IRRIG 1000ML POUR BTL (IV SOLUTION) ×4 IMPLANT
OIL CARTRIDGE MAESTRO DRILL (MISCELLANEOUS) ×8
PACK LAMINECTOMY NEURO (CUSTOM PROCEDURE TRAY) ×8 IMPLANT
PAD ARMBOARD 7.5X6 YLW CONV (MISCELLANEOUS) ×8 IMPLANT
PATTIES SURGICAL .5 X.5 (GAUZE/BANDAGES/DRESSINGS) IMPLANT
PATTIES SURGICAL .5 X1 (DISPOSABLE) IMPLANT
PATTIES SURGICAL 1X1 (DISPOSABLE) IMPLANT
POWER MODULE NUVASIVE (MISCELLANEOUS) ×4
PUTTY BONE ATTRAX 10CC STRIP (Putty) ×4 IMPLANT
RASP 3.0MM (RASP) ×4 IMPLANT
ROD RELINE 0-0 CON M 5.0/6.0MM (Rod) ×16 IMPLANT
ROD RELINE-O 5.5X300 STRT NS (Rod) ×4 IMPLANT
ROD RELINE-O 5.5X300MM STRT (Rod) ×4 IMPLANT
SCREW LOCK RELINE 5.5 TULIP (Screw) ×72 IMPLANT
SCREW RELINE 7.5X 80MM 2S POLY (Screw) ×8 IMPLANT
SCREW RELINE-O POLY 5.5X40 (Screw) ×16 IMPLANT
SCREW RELINE-O POLY 6.5X40 (Screw) ×8 IMPLANT
SCREW RELINE-O POLY 7.5X40 (Screw) ×8 IMPLANT
SPONGE INTESTINAL PEANUT (DISPOSABLE) ×4 IMPLANT
SPONGE LAP 18X18 RF (DISPOSABLE) ×4 IMPLANT
SPONGE LAP 4X18 RFD (DISPOSABLE) IMPLANT
SPONGE SURGIFOAM ABS GEL 100 (HEMOSTASIS) ×4 IMPLANT
SPONGE SURGIFOAM ABS GEL SZ50 (HEMOSTASIS) IMPLANT
STAPLER SKIN PROX WIDE 3.9 (STAPLE) IMPLANT
STAPLER VISISTAT 35W (STAPLE) ×4 IMPLANT
SUT MNCRL AB 4-0 PS2 18 (SUTURE) IMPLANT
SUT PDS AB 1 CTX 36 (SUTURE) ×4 IMPLANT
SUT PROLENE 4 0 RB 1 (SUTURE)
SUT PROLENE 4-0 RB1 .5 CRCL 36 (SUTURE) IMPLANT
SUT PROLENE 5 0 CC1 (SUTURE) IMPLANT
SUT PROLENE 6 0 C 1 30 (SUTURE) ×4 IMPLANT
SUT PROLENE 6 0 CC (SUTURE) IMPLANT
SUT SILK 0 TIES 10X30 (SUTURE) ×4 IMPLANT
SUT SILK 2 0 TIES 10X30 (SUTURE) ×4 IMPLANT
SUT SILK 2 0 TIES 17X18 (SUTURE) ×2
SUT SILK 2 0SH CR/8 30 (SUTURE) IMPLANT
SUT SILK 2-0 18XBRD TIE BLK (SUTURE) ×2 IMPLANT
SUT SILK 3 0 TIES 10X30 (SUTURE) ×4 IMPLANT
SUT SILK 3 0SH CR/8 30 (SUTURE) IMPLANT
SUT VIC AB 0 CT1 27 (SUTURE)
SUT VIC AB 0 CT1 27XBRD ANBCTR (SUTURE) IMPLANT
SUT VIC AB 1 CT1 18XBRD ANBCTR (SUTURE) ×4 IMPLANT
SUT VIC AB 1 CT1 8-18 (SUTURE) ×4
SUT VIC AB 2-0 CT1 18 (SUTURE) ×20 IMPLANT
SUT VIC AB 2-0 CT1 27 (SUTURE) ×2
SUT VIC AB 2-0 CT1 TAPERPNT 27 (SUTURE) ×2 IMPLANT
SUT VIC AB 3-0 SH 27 (SUTURE)
SUT VIC AB 3-0 SH 27X BRD (SUTURE) IMPLANT
SUT VIC AB 3-0 SH 8-18 (SUTURE) ×24 IMPLANT
SUT VICRYL 4-0 PS2 18IN ABS (SUTURE) IMPLANT
SYR 30ML LL (SYRINGE) ×4 IMPLANT
SYR 5ML LL (SYRINGE) IMPLANT
TOWEL GREEN STERILE (TOWEL DISPOSABLE) ×12 IMPLANT
TOWEL GREEN STERILE FF (TOWEL DISPOSABLE) ×8 IMPLANT
TRAP SPECIMEN MUCUS 40CC (MISCELLANEOUS) ×4 IMPLANT
TRAY FOLEY MTR SLVR 16FR STAT (SET/KITS/TRAYS/PACK) ×4 IMPLANT
TUBE CONNECTING 20'X1/4 (TUBING) ×1
TUBE CONNECTING 20X1/4 (TUBING) ×3 IMPLANT
WATER STERILE IRR 1000ML POUR (IV SOLUTION) ×8 IMPLANT

## 2020-04-27 NOTE — Op Note (Signed)
    Patient name: Tammy Booth MRN: [id number] DOB: 05/05/1956 Sex: female  04/27/2020 Pre-operative Diagnosis: Degenerative back disease Post-operative diagnosis:  Same Surgeon:  Annamarie Major Assistants:  Verdis Prime Co-Surgeon:  Dr. Vertell Limber Procedure:   Anterior exposure L5-S1 Anesthesia:  Gneeral Blood Loss:  100cc Specimens:  none  Findings:  Normal anatomy  Indications:  64 year old female who comes in today for anterior and posterior instrumentation.  Details again discussed with patient in holding  Procedure:  The patient was identified in the holding area and taken to [place] [facility]  The patient was then placed supine on the table. general anesthesia was administered.  The patient was prepped and draped in the usual sterile fashion.  A time out was called and antibiotics were administered.  Flouroscopy was utilized to locate the appropriate level of skin incision..  A transverse left lower quadrant incision was made.  Cautery was used to divide the subcutaneous tissue down to the fascia which was open with cautery.  Sub-fascial flaps were raised.  The retroperitoneum was entered lateral to the rectus muscle.  I bluntly created a plane along the anterior surface of the iliac artery. The ureter was identified and had to be dissected out in order to reflect it laterally without tension.  I then bluntly dissected the tissue off of the spine and identified the disk.  The median sacral vessels were divided between metal clips.  THe Nuvasive retractor was positioned.  15o blades were placed on either side of the spine, protecting the iliac vein.  A 120 blade was placed superiorly, and 150 blades were placed inferiorly.  A spinal needle was placed in the disc space.  Flouro returned and determined that we were at the L5-S1 disc space.  At this time, Dr Vertell Limber came in and performed the remaining portion of the procedure.   Disposition:  To PACU stable   V. Annamarie Major, M.D., [facility] [place] Office: [phone] Pager:  [phone]

## 2020-04-27 NOTE — Brief Op Note (Signed)
04/27/2020  2:58 PM  PATIENT:  Tammy Booth  64 y.o. female  PRE-OPERATIVE DIAGNOSIS:  Other idiopathic scoliosis, thoracoumbar region, kyphosis, lumbar foraminal stenosis, lumbago, radiculopathy   POST-OPERATIVE DIAGNOSIS:  Other idiopathic scoliosis, thoracoumbar region, kyphosis, lumbar foraminal stenosis, lumbago, radiculopathy   PROCEDURE:  Procedure(s): Lumbar five-Sacral one Anterior lumbar interbody fusion with Thoracic ten to Lumbar one Fixation, Sacral one to pelvis fixation, exploration of previous fusion (N/A) thoracic ten to lumbar one fixation ,sacral one to pelvis fixation (N/A) APPLICATION OF INTRAOPERATIVE CT SCAN (N/A) ABDOMINAL EXPOSURE FOR LUMBAR FIVE SACRAL ONE (N/A) posterolateral arthrodesis  SURGEON:  Surgeon(s) and Role: Panel 1:    Erline Levine, MD - Primary Panel 2:    * Serafina Mitchell, MD - Primary  PHYSICIAN ASSISTANT: Glenford Peers, NP  ASSISTANTS: Poteat, RN   ANESTHESIA:   general  EBL:  250 mL   BLOOD ADMINISTERED:none  DRAINS: none   LOCAL MEDICATIONS USED:  MARCAINE    and LIDOCAINE   SPECIMEN:  No Specimen  DISPOSITION OF SPECIMEN:  N/A  COUNTS:  YES  TOURNIQUET:  * No tourniquets in log *  DICTATION: DICTATION:   INDICATIONS:  Pateint is 64 year old female with chronic and intractable back and bilateral lower extremity pain with marked disc degeneration and spondylosis who had previously undergone decompression and fusion L 1 - L 5 levels.  She developed a severe coronal deformity at L 12 and T 12 L 1 levels and is out of balance.  It was elected to perform L 5 S 1 ALIF, then exploration of L 1 - L 5 fusion with pedicle screw fixation T 10 - Ilium bilaterally with posterolateral arthrodesis.   PROCEDURE:  The patient was placed in a supine on the operating table.  Doctor Trula Slade performed exposure and his portion of the procedure will be dictated separately. A localizing X ray was obtained with the C arm.  I then incised the  anterior annulus and performed a thorough discectomy.  The endplates were cleared of disc and cartilagenous material and a thorough discectomy was performed with decompression of the ventral annulus and disc material.  The L 5 S 1 disc was highly degenerated. I did a thorough discectomy and after trial sizing, placed a 6 x 38 x 28 mm x 15 degree titanium Base cage, packed with extra extra small BMP and Attrax graft extender.   Two 5.0 x 17.5 mm screws were then affixed to the S1 vertebra and one (5.0 x 20 mm) to the L 5 vertebra.  Locking mechanisms were engaged, soft tissues were inspected and found to be in good repair.  Retractors were removed.  Fascia was closed with 1 PDS running stitch, skin edges closed with 2-0 and 3-0 vicryl sutures.  Wound was dressed with a sterile occlusive dressing.   Patient was then turned prone on the OR table for Airo intraoperative CT scanner and, after prepping and draping her low back, the incision was opened and the previously placed hardware was exposed from L 2 - L 5 levels. Exposure was carried from T 9 through both iliac crests to expose S 2 A I for pelvic screw placement.    Connectors were placed between the L 1 and L 2 screws bilaterally and between L 3 and L 4 on the right and L 4 and L 5 on the left.  The bone mass was inspected and there was found to be good bone growth.  There was no loosening  of hardware.  Using intraoperative navigation with a CT spin The spinous process clamp and array were placed at T 9 and a CT scan was obtained. Screws were replaced at T 10 and T 11 (5.5 x 49 mm at each level) and 6.5 x 40 at T 12 bilaterally.   Subsequently, a 7.5 x 40 mm screw was placed at S 1 on both sides.  7.5 x 80 mm  S 2 A I iliac screws were placed on each side using navigation.  There was good trajectory and no cutouts.  A second CT scan confirmed proper positioning of all screws.   Rods and connectors were attached using Bendini rod bending system and sagittal  correction of thoracolumbar deformity was performed with distraction on the right and compression on the left with lateral translation to the left.   80 cc of allograft chips along with autograft harvested from the pelvis and small BMP was placed over the decorticated thoracolumbar spine.  Long-acting Marcaine was injected in the musculature.  The incision was closed with 1, 2-0, 3-0 vicryl sutures and Dermabond.  A sterile occlusive dressing was placed.   Patient was extubated in the OR and taken to recovery having tolerated her surgery well.  Counts were correct at the end of the case.   PLAN OF CARE: Admit to inpatient   PATIENT DISPOSITION:  PACU - hemodynamically stable.   Delay start of Pharmacological VTE agent (>24hrs) due to surgical blood loss or risk of bleeding: yes

## 2020-04-27 NOTE — Transfer of Care (Signed)
Immediate Anesthesia Transfer of Care Note  Patient: Tammy Booth  Procedure(s) Performed: Lumbar five-Sacral one Anterior lumbar interbody fusion with Thoracic ten to Lumbar one Fixation, Sacral one to pelvis fixation, exploration of previous fusion (N/A ) thoracic ten to lumbar one fixation ,sacral one to pelvis fixation (N/A Back) APPLICATION OF INTRAOPERATIVE CT SCAN (N/A ) ABDOMINAL EXPOSURE FOR LUMBAR FIVE SACRAL ONE (N/A )  Patient Location: PACU  Anesthesia Type:General  Level of Consciousness: oriented, drowsy and patient cooperative  Airway & Oxygen Therapy: Patient Spontanous Breathing and Patient connected to face mask oxygen  Post-op Assessment: Report given to RN and Post -op Vital signs reviewed and stable  Post vital signs: Reviewed  Last Vitals:  Vitals Value Taken Time  BP 130/70 04/27/20 1506  Temp    Pulse 83 04/27/20 1513  Resp 19 04/27/20 1513  SpO2 100 % 04/27/20 1513  Vitals shown include unvalidated device data.  Last Pain:  Vitals:   04/27/20 0657  TempSrc:   PainSc: 5       Patients Stated Pain Goal: 3 (79/44/46 1901)  Complications: No complications documented.

## 2020-04-27 NOTE — Anesthesia Postprocedure Evaluation (Signed)
Anesthesia Post Note  Patient: Tammy Booth  Procedure(s) Performed: Lumbar five-Sacral one Anterior lumbar interbody fusion with Thoracic ten to Lumbar one Fixation, Sacral one to pelvis fixation, exploration of previous fusion (N/A ) thoracic ten to lumbar one fixation ,sacral one to pelvis fixation (N/A Back) APPLICATION OF INTRAOPERATIVE CT SCAN (N/A ) ABDOMINAL EXPOSURE FOR LUMBAR FIVE SACRAL ONE (N/A )     Patient location during evaluation: PACU Anesthesia Type: General Level of consciousness: awake and alert Pain management: pain level controlled Vital Signs Assessment: post-procedure vital signs reviewed and stable Respiratory status: spontaneous breathing, nonlabored ventilation and respiratory function stable Cardiovascular status: blood pressure returned to baseline and stable Postop Assessment: no apparent nausea or vomiting Anesthetic complications: no   No complications documented.  Last Vitals:  Vitals:   04/27/20 1707 04/27/20 1715  BP: 129/79   Pulse: 82 88  Resp: 18 20  Temp:    SpO2: 100% 100%    Last Pain:  Vitals:   04/27/20 1700  TempSrc:   PainSc: [place]

## 2020-04-27 NOTE — Anesthesia Procedure Notes (Signed)
Arterial Line Insertion Start/End8/19/2021 7:00 AM, 04/27/2020 7:10 AM Performed by: Jenne Campus, CRNA, CRNA  Patient location: Pre-op. Preanesthetic checklist: patient identified, IV checked, risks and benefits discussed, surgical consent, monitors and equipment checked, pre-op evaluation, timeout performed and anesthesia consent Lidocaine 1% used for infiltration Left, radial was placed Catheter size: 20 G Hand hygiene performed  and maximum sterile barriers used   Attempts: 1 Procedure performed without using ultrasound guided technique. Following insertion, dressing applied and Biopatch. Post procedure assessment: normal  Patient tolerated the procedure well with no immediate complications.

## 2020-04-27 NOTE — Op Note (Signed)
04/27/2020  2:58 PM  PATIENT:  Tammy Booth  64 y.o. female  PRE-OPERATIVE DIAGNOSIS:  Other idiopathic scoliosis, thoracoumbar region, kyphosis, lumbar foraminal stenosis, lumbago, radiculopathy   POST-OPERATIVE DIAGNOSIS:  Other idiopathic scoliosis, thoracoumbar region, kyphosis, lumbar foraminal stenosis, lumbago, radiculopathy   PROCEDURE:  Procedure(s): Lumbar five-Sacral one Anterior lumbar interbody fusion with Thoracic ten to Lumbar one Fixation, Sacral one to pelvis fixation, exploration of previous fusion (N/A) thoracic ten to lumbar one fixation ,sacral one to pelvis fixation (N/A) APPLICATION OF INTRAOPERATIVE CT SCAN (N/A) ABDOMINAL EXPOSURE FOR LUMBAR FIVE SACRAL ONE (N/A) posterolateral arthrodesis  SURGEON:  Surgeon(s) and Role: Panel 1:    Erline Levine, MD - Primary Panel 2:    * Serafina Mitchell, MD - Primary  PHYSICIAN ASSISTANT: Glenford Peers, NP  ASSISTANTS: Poteat, RN   ANESTHESIA:   general  EBL:  250 mL   BLOOD ADMINISTERED:none  DRAINS: none   LOCAL MEDICATIONS USED:  MARCAINE    and LIDOCAINE   SPECIMEN:  No Specimen  DISPOSITION OF SPECIMEN:  N/A  COUNTS:  YES  TOURNIQUET:  * No tourniquets in log *  DICTATION: DICTATION:   INDICATIONS:  Pateint is 64 year old female with chronic and intractable back and bilateral lower extremity pain with marked disc degeneration and spondylosis who had previously undergone decompression and fusion L 1 - L 5 levels.  She developed a severe coronal deformity at L 12 and T 12 L 1 levels and is out of balance.  It was elected to perform L 5 S 1 ALIF, then exploration of L 1 - L 5 fusion with pedicle screw fixation T 10 - Ilium bilaterally with posterolateral arthrodesis.   PROCEDURE:  The patient was placed in a supine on the operating table.  Doctor Trula Slade performed exposure and his portion of the procedure will be dictated separately. A localizing X ray was obtained with the C arm.  I then incised the  anterior annulus and performed a thorough discectomy.  The endplates were cleared of disc and cartilagenous material and a thorough discectomy was performed with decompression of the ventral annulus and disc material.  The L 5 S 1 disc was highly degenerated. I did a thorough discectomy and after trial sizing, placed a 6 x 38 x 28 mm x 15 degree titanium Base cage, packed with extra extra small BMP and Attrax graft extender.   Two 5.0 x 17.5 mm screws were then affixed to the S1 vertebra and one (5.0 x 20 mm) to the L 5 vertebra.  Locking mechanisms were engaged, soft tissues were inspected and found to be in good repair.  Retractors were removed.  Fascia was closed with 1 PDS running stitch, skin edges closed with 2-0 and 3-0 vicryl sutures.  Wound was dressed with a sterile occlusive dressing.   Patient was then turned prone on the OR table for Airo intraoperative CT scanner and, after prepping and draping her low back, the incision was opened and the previously placed hardware was exposed from L 2 - L 5 levels. Exposure was carried from T 9 through both iliac crests to expose S 2 A I for pelvic screw placement.    Connectors were placed between the L 1 and L 2 screws bilaterally and between L 3 and L 4 on the right and L 4 and L 5 on the left.  The bone mass was inspected and there was found to be good bone growth.  There was no loosening  of hardware.  Using intraoperative navigation with a CT spin The spinous process clamp and array were placed at T 9 and a CT scan was obtained. Screws were replaced at T 10 and T 11 (5.5 x 49 mm at each level) and 6.5 x 40 at T 12 bilaterally.   Subsequently, a 7.5 x 40 mm screw was placed at S 1 on both sides.  7.5 x 80 mm  S 2 A I iliac screws were placed on each side using navigation.  There was good trajectory and no cutouts.  A second CT scan confirmed proper positioning of all screws.   Rods and connectors were attached using Bendini rod bending system and sagittal  correction of thoracolumbar deformity was performed with distraction on the right and compression on the left with lateral translation to the left.   80 cc of allograft chips along with autograft harvested from the pelvis and small BMP was placed over the decorticated thoracolumbar spine.  Long-acting Marcaine was injected in the musculature.  The incision was closed with 1, 2-0, 3-0 vicryl sutures and Dermabond.  A sterile occlusive dressing was placed.   Patient was extubated in the OR and taken to recovery having tolerated her surgery well.  Counts were correct at the end of the case.   PLAN OF CARE: Admit to inpatient   PATIENT DISPOSITION:  PACU - hemodynamically stable.   Delay start of Pharmacological VTE agent (>24hrs) due to surgical blood loss or risk of bleeding: yes

## 2020-04-27 NOTE — Progress Notes (Signed)
Pt brought to 4NP14 from PACU. Pt A&Ox4, vital signs taken and WNL, and pt's sensation intact. Report given to nightshift RN, Darlyne Russian, who agrees to complete admission assessment/information.   Justice Rocher, RN

## 2020-04-27 NOTE — Progress Notes (Signed)
Orthopedic Tech Progress Note Patient Details:  SHAIRA SOVA 04-Nov-1955 [id number] Called RN to see if patient has brace she gave me the number to the room to speak directly with patient and she told me she already has a brace. Patient ID: CARYLON TAMBURRO, female   DOB: Jan 28, 1956, 64 y.o.   MRN: [id number]   Ellouise Newer 04/27/2020, 8:33 PM

## 2020-04-27 NOTE — Interval H&P Note (Signed)
History and Physical Interval Note:  04/27/2020 7:25 AM  Tammy Booth  has presented today for surgery, with the diagnosis of Other idiopathic scoliosis, Lumbar region.  The various methods of treatment have been discussed with the patient and family. After consideration of risks, benefits and other options for treatment, the patient has consented to  Procedure(s): Lumbar 5-Sacral 1 Anterior lumbar interbody fusion with Thoracic 10 to Lumbar 1 Fixation, Sacral 1 to pelvis fixation, exploration of previous fusion (N/A) POSTERIOR LUMBAR FUSION 3 LEVEL (N/A) APPLICATION OF INTRAOPERATIVE CT SCAN (N/A) ABDOMINAL EXPOSURE (N/A) as a surgical intervention.  The patient's history has been reviewed, patient examined, no change in status, stable for surgery.  I have reviewed the patient's chart and labs.  Questions were answered to the patient's satisfaction.     Peggyann Shoals

## 2020-04-27 NOTE — H&P (Signed)
[facility] [place]  Patient name: Tammy Booth           MRN: [id number]        DOB: 22-Aug-1956          Sex: female   REQUESTING PROVIDER:    Dr. Vertell Limber            REASON FOR CONSULT:    ALIF L5-S1  HISTORY OF PRESENT ILLNESS:   Tammy Booth is a 64 y.o. female, who is referred for evaluation of anterior exposure of L5-S1.  Patient has a history of prior back surgery and is having recurrent issues.  Anterior approach has been recommended in addition to additional posterior work.  The patient's only abdominal surgeries have been a hysterectomy via a low transverse C-section incision and a laparoscopic cholecystectomy.  She is a smoker.  PAST MEDICAL HISTORY        Past Medical History:  Diagnosis Date  . Back pain   . Constipation   . DDD (degenerative disc disease), lumbar   . Fatigue   . Hypertension    PRIMARY  . Left hip pain   . Left leg pain   . Night sweats   . Radiculopathy    Lumbar region  . Thyroid disease      FAMILY HISTORY        Family History  Problem Relation Age of Onset  . Diabetes Mother   . Cancer Mother   . Hypertension Mother   . Heart disease Father   . Hypertension Father   . Cancer Father   . Cancer Sister   . Hypertension Sister   . Diabetes Other   . Cancer Other   . Dementia Other     SOCIAL HISTORY:   Social History        Socioeconomic History  . Marital status: Widowed    Spouse name: Not on file  . Number of children: Not on file  . Years of education: Not on file  . Highest education level: Not on file  Occupational History  . Occupation: Disabled  Tobacco Use  . Smoking status: Current Every Day Smoker    Packs/day: 1.50    Years: 44.00    Pack years: 66.00  Substance and Sexual Activity  . Alcohol use: Never  . Drug use: Never  . Sexual activity: Not on file  Other Topics Concern  . Not on file   Social History Narrative  . Not on file   Social Determinants of Health      Financial Resource Strain:   . Difficulty of Paying Living Expenses:   Food Insecurity:   . Worried About Charity fundraiser in the Last Year:   . Arboriculturist in the Last Year:   Transportation Needs:   . Film/video editor (Medical):   Marland Kitchen Lack of Transportation (Non-Medical):   Physical Activity:   . Days of Exercise per Week:   . Minutes of Exercise per Session:   Stress:   . Feeling of Stress :   Social Connections:   . Frequency of Communication with Friends and Family:   . Frequency of Social Gatherings with Friends and Family:   . Attends Religious Services:   . Active Member of Clubs or Organizations:   . Attends Archivist Meetings:   .  Marital Status:   Intimate Partner Violence:   . Fear of Current or Ex-Partner:   . Emotionally Abused:   Marland Kitchen Physically Abused:   . Sexually Abused:     ALLERGIES:    No Known Allergies  CURRENT MEDICATIONS:          Current Outpatient Medications  Medication Sig Dispense Refill  . Calcium Carb-Cholecalciferol (CALCIUM 500+D PO) Take by mouth.    . carvedilol (COREG) 3.125 MG tablet Take 3.125 mg by mouth 2 (two) times daily with a meal.    . Cholecalciferol (VITAMIN D3) 1.25 MG (50000 UT) TABS Take by mouth.    Marland Kitchen ibuprofen (ADVIL) 200 MG tablet Take 200 mg by mouth every 6 (six) hours as needed.    Marland Kitchen levothyroxine (SYNTHROID) 112 MCG tablet Take 112 mcg by mouth daily before breakfast.    . LOW-DOSE ASPIRIN PO Take by mouth.    . polycarbophil (FIBERCON) 625 MG tablet Take 625 mg by mouth daily.     No current facility-administered medications for this visit.    REVIEW OF SYSTEMS:   [X]  denotes positive finding, [ ]  denotes negative finding Cardiac  Comments:  Chest pain or chest pressure:    Shortness of breath upon exertion:    Short of breath when lying flat:    Irregular heart  rhythm:        Vascular    Pain in calf, thigh, or hip brought on by ambulation:    Pain in feet at night that wakes you up from your sleep:     Blood clot in your veins:    Leg swelling:         Pulmonary    Oxygen at home:    Productive cough:     Wheezing:         Neurologic    Sudden weakness in arms or legs:     Sudden numbness in arms or legs:     Sudden onset of difficulty speaking or slurred speech:    Temporary loss of vision in one eye:     Problems with dizziness:         Gastrointestinal    Blood in stool:      Vomited blood:         Genitourinary    Burning when urinating:     Blood in urine:        Psychiatric    Major depression:         Hematologic    Bleeding problems:    Problems with blood clotting too easily:        Skin    Rashes or ulcers:        Constitutional    Fever or chills:     PHYSICAL EXAM:   There were no vitals filed for this visit.  GENERAL: The patient is a well-nourished female, in no acute distress. The vital signs are documented above. CARDIAC: There is a regular rate and rhythm.  VASCULAR: Palpable pedal pulses PULMONARY: Nonlabored respirations ABDOMEN: Soft and non-tender  MUSCULOSKELETAL: There are no major deformities or cyanosis. NEUROLOGIC: No focal weakness or paresthesias are detected. SKIN: There are no ulcers or rashes noted. PSYCHIATRIC: The patient has a normal affect.  STUDIES:   I have reviewed her prior x-rays and MRI which did not show any significant calcification within her vasculature  ASSESSMENT and PLAN   The patient is a good candidate for anterior exposure of the L5-S1 disc space.  I  discussed the risk benefits of the procedure including but not limited to the risk of injury to the iliac artery and vein, the ureter.  We also discussed potential wound issues including infection and hernia.  All of  her questions were answered.  Her procedure been scheduled for May 20.   Annamarie Major, IV, MD, FACS [facility] [phone] Pager [phone]   No interval changes CV:RRR PULM:CTA Palp pedal pulses  Plan L5-S1 exposure.  All questions answered.  Annamarie Major

## 2020-04-27 NOTE — Anesthesia Procedure Notes (Signed)
Procedure Name: Intubation Date/Time: 04/27/2020 7:48 AM Performed by: Jenne Campus, CRNA Pre-anesthesia Checklist: Patient identified, Emergency Drugs available, Suction available and Patient being monitored Patient Re-evaluated:Patient Re-evaluated prior to induction Oxygen Delivery Method: Circle System Utilized Preoxygenation: Pre-oxygenation with 100% oxygen Induction Type: IV induction Ventilation: Mask ventilation without difficulty Laryngoscope Size: Miller and 2 Grade View: Grade I Tube type: Oral Tube size: 7.0 mm Number of attempts: 1 Airway Equipment and Method: Stylet and Oral airway Placement Confirmation: ETT inserted through vocal cords under direct vision,  positive ETCO2 and breath sounds checked- equal and bilateral Secured at: 21 cm Tube secured with: Tape Dental Injury: Teeth and Oropharynx as per pre-operative assessment

## 2020-04-27 NOTE — Progress Notes (Signed)
Patient ID: Tammy Booth, female   DOB: 10-08-1955, 64 y.o.   MRN: [id number] Waking up, following commands readily. Reports low back pain as expected. MAEW. Good strength BLE.   Will touch base with her daughter [name] Comment via phone.

## 2020-04-28 ENCOUNTER — Encounter (HOSPITAL_COMMUNITY): Payer: Self-pay | Admitting: Neurosurgery

## 2020-04-28 MED ORDER — PANTOPRAZOLE SODIUM 40 MG PO TBEC
40.0000 mg | DELAYED_RELEASE_TABLET | Freq: Every day | ORAL | Status: DC
  Administered 2020-04-28 – 2020-05-02 (×5): 40 mg via ORAL
  Filled 2020-04-28 (×5): qty 1

## 2020-04-28 NOTE — Evaluation (Signed)
Physical Therapy Evaluation Patient Details Name: Tammy Booth MRN: [id number] DOB: November 01, 1955 Today's Date: 04/28/2020   History of Present Illness  Pt underent lumbar five-Sacral one Anterior lumbar interbody fusion with Thoracic ten to Lumbar one Fixation, Sacral one to pelvis fixation on 8/19  Clinical Impression  Patient presents with decreased mobility due to pain & nausea this am, decreased balance, decreased safety awareness and knowledge of precautions along with generalized weakness.  She lives alone and has intermittent help at d/c.  Patient hopeful for progress to be able to return home, but currently most appropriate for SNF level rehab.  PT to follow acutely.     Follow Up Recommendations SNF (depending on progress)    Equipment Recommendations  Rolling walker with 5" wheels;3in1 (PT)    Recommendations for Other Services       Precautions / Restrictions Precautions Precautions: Fall;Back Precaution Booklet Issued: Yes (comment) Required Braces or Orthoses: Spinal Brace Spinal Brace: Applied in sitting position;Thoracolumbosacral orthotic Restrictions Weight Bearing Restrictions: No      Mobility  Bed Mobility Overal bed mobility: Needs Assistance Bed Mobility: Sit to Sidelying Rolling: Min guard Sidelying to sit: Min guard;HOB elevated     Sit to sidelying: Min assist General bed mobility comments: could not tolerate HOB flat to enter the bed, so elevated, reports has a wedge at home  Transfers Overall transfer level: Needs assistance Equipment used: Rolling walker (2 wheeled) Transfers: Sit to/from Stand Sit to Stand: Min assist Stand pivot transfers: Min guard       General transfer comment: up from recliner, cues for hand placement, assist for balance as reports feeling woozy from medication  Ambulation/Gait Ambulation/Gait assistance: Min assist Gait Distance (Feet): 20 Feet Assistive device: Rolling walker (2 wheeled) Gait  Pattern/deviations: Step-through pattern;Step-to pattern;Decreased stride length;Shuffle     General Gait Details: to door and back to bed, assist for balance and walker proximity  Stairs            Wheelchair Mobility    Modified Rankin (Stroke Patients Only)       Balance Overall balance assessment: Needs assistance Sitting-balance support: Feet supported;Bilateral upper extremity supported Sitting balance-Leahy Scale: Poor Sitting balance - Comments: needs Bil UE support due to pain in right hip   Standing balance support: Bilateral upper extremity supported Standing balance-Leahy Scale: Poor                               Pertinent Vitals/Pain Pain Assessment: Faces Pain Score: 9  Faces Pain Scale: Hurts whole lot Pain Location: R hip with ambulation Pain Descriptors / Indicators: Aching;Tightness;Sore Pain Intervention(s): Monitored during session;Repositioned    Home Living Family/patient expects to be discharged to:: Private residence Living Arrangements: Alone Available Help at Discharge: Family;Available PRN/intermittently Type of Home: House Home Access: Stairs to enter   Entrance Stairs-Number of Steps: 1 Home Layout: Two level Home Equipment: Toilet riser;Walker - 2 wheels;Hand held shower head;Shower seat      Prior Function Level of Independence: Independent               Hand Dominance   Dominant Hand: Right    Extremity/Trunk Assessment   Upper Extremity Assessment Upper Extremity Assessment: Defer to OT evaluation    Lower Extremity Assessment Lower Extremity Assessment: Generalized weakness    Cervical / Trunk Assessment Cervical / Trunk Assessment: Other exceptions Cervical / Trunk Exceptions: s/p back surgery  Communication   Communication:  No difficulties  Cognition Arousal/Alertness: Awake/alert Behavior During Therapy: WFL for tasks assessed/performed Overall Cognitive Status: Within Functional Limits  for tasks assessed                                        General Comments General comments (skin integrity, edema, etc.): assist to doff brace at EOB prior to supine, daughter in the room, but on the phone entire session for work    Exercises     Assessment/Plan    PT Assessment Patient needs continued PT services  PT Problem List Decreased strength;Decreased mobility;Decreased activity tolerance;Decreased balance;Decreased knowledge of use of DME;Pain;Decreased safety awareness       PT Treatment Interventions DME instruction;Therapeutic activities;Gait training;Therapeutic exercise;Patient/family education;Balance training;Functional mobility training    PT Goals (Current goals can be found in the Care Plan section)  Acute Rehab PT Goals Patient Stated Goal: to be able to go home hopefully and not rehab PT Goal Formulation: With patient Time For Goal Achievement: 05/12/20 Potential to Achieve Goals: Good    Frequency Min 4X/week   Barriers to discharge        Co-evaluation               AM-PAC PT "6 Clicks" Mobility  Outcome Measure Help needed turning from your back to your side while in a flat bed without using bedrails?: A Little Help needed moving from lying on your back to sitting on the side of a flat bed without using bedrails?: A Little Help needed moving to and from a bed to a chair (including a wheelchair)?: A Little Help needed standing up from a chair using your arms (e.g., wheelchair or bedside chair)?: A Little Help needed to walk in hospital room?: A Little Help needed climbing 3-5 steps with a railing? : A Lot 6 Click Score: 17    End of Session Equipment Utilized During Treatment: Back brace Activity Tolerance: Patient limited by fatigue (nausea end of session)     PT Visit Diagnosis: Other abnormalities of gait and mobility (R26.89);Muscle weakness (generalized) (M62.81);Pain Pain - Right/Left: Right Pain - part of body:  Hip (back, R hip)    Time: 5176-1607 PT Time Calculation (min) (ACUTE ONLY): 23 min   Charges:   PT Evaluation $PT Eval Moderate Complexity: 1 Mod PT Treatments $Gait Training: 8-22 mins        Magda Kiel, PT Acute Rehabilitation Services Pager:[phone] Office:[phone] 04/28/2020   Reginia Naas 04/28/2020, 11:13 AM

## 2020-04-28 NOTE — Progress Notes (Addendum)
Subjective: Patient reports "I have some pain in this (right) hip, better than it was , but still there"  Objective: Vital signs in last 24 hours: Temp:  [97 F (36.1 C)-99.1 F (37.3 C)] 98.7 F (37.1 C) (08/20 0800) Pulse Rate:  [65-88] 67 (08/20 0800) Resp:  [9-20] 16 (08/20 0800) BP: (100-130)/(64-79) 105/66 (08/20 0800) SpO2:  [95 %-100 %] 95 % (08/20 0800)  Intake/Output from previous day: 08/19 0701 - 08/20 0700 In: 3150 [I.V.:2800; IV Piggyback:350] Out: 7322 [GURKY:7062; Blood:250] Intake/Output this shift: No intake/output data recorded.  Alert, conversant, reporting mild lumbar pain and moderate right hip pain. Denies leg pain, numbness, or tingling. Good strength BLE. Repositions herself in bed with ease, limited only by soreness. Abdomen soft, flat. Incision without erythema, swelling or drainage beneath honeycomb and Dermabond. Not yet passing gas, but no reported discomfort. Thoracic and lumbar incisions without erythema, swelling or drainage beneath honeycomb and Dermabond.   Lab Results: Recent Labs    04/25/20 1138  WBC 8.9  HGB 13.8  HCT 42.0  PLT 319   BMET Recent Labs    04/25/20 1138  NA 139  K 4.4  CL 106  CO2 26  GLUCOSE 99  BUN 20  CREATININE 1.14*  CALCIUM 10.0    Studies/Results: DG Lumbar Spine 2-3 Views  Result Date: 04/27/2020 CLINICAL DATA:  Surgery, elective. Additional history provided: L5-S1 ALIF. Reported fluoroscopy time 37 seconds, 23.05 mGy. EXAM: LUMBAR SPINE - 2-3 VIEW; DG C-ARM 1-60 MIN COMPARISON:  Lumbar spine MRI 11/11/2019 FINDINGS: Two intraoperative fluoroscopic images of the lower lumbar spine are submitted for evaluation, one lateral and one AP. Sequela of interval disc replacement and anterior fusion at L5-S1. Partially visualized bilateral pedicle screws and vertical interconnecting rods at L4-L5. The vertical interconnecting rods extend cephalad beyond the field of view. L4-L5 interbody spacer. No unexpected finding.  IMPRESSION: Two intraoperative fluoroscopic images of the lower lumbar spine as described. Electronically Signed   By: Kellie Simmering DO   On: 04/27/2020 12:19   DG C-Arm 1-60 Min  Result Date: 04/27/2020 CLINICAL DATA:  Surgery, elective. Additional history provided: L5-S1 ALIF. Reported fluoroscopy time 37 seconds, 23.05 mGy. EXAM: LUMBAR SPINE - 2-3 VIEW; DG C-ARM 1-60 MIN COMPARISON:  Lumbar spine MRI 11/11/2019 FINDINGS: Two intraoperative fluoroscopic images of the lower lumbar spine are submitted for evaluation, one lateral and one AP. Sequela of interval disc replacement and anterior fusion at L5-S1. Partially visualized bilateral pedicle screws and vertical interconnecting rods at L4-L5. The vertical interconnecting rods extend cephalad beyond the field of view. L4-L5 interbody spacer. No unexpected finding. IMPRESSION: Two intraoperative fluoroscopic images of the lower lumbar spine as described. Electronically Signed   By: Kellie Simmering DO   On: 04/27/2020 12:19   DG OR LOCAL ABDOMEN  Result Date: 04/27/2020 CLINICAL DATA:  Intraoperative study for assessment for potential retained foreign body EXAM: OR LOCAL ABDOMEN COMPARISON:  None. FINDINGS: Extensive postoperative change in the lumbar spine evident. No retained needle or sponge evident. No bowel obstruction or free air. Moderate stool in colon. IMPRESSION: No retained foreign body. Extensive postoperative change in lumbar spine. No bowel obstruction or free air. Critical Value/emergent results were called by telephone at the time of interpretation on 04/27/2020 at 9:41 am to provider Bryna Colander, RN, who verbally acknowledged these results. Electronically Signed   By: Lowella Grip III M.D.   On: 04/27/2020 09:42    Assessment/Plan: improving  LOS: 1 day  Pt agreeable to transition  from IV pain med to oral. Muscle relaxer will likely offer some relief for hip, given intra-op muscle retraction for S2 screw placement. Mobilize in  brace. Pt knows to request Dulcolax suppository if she begins to experience abdominal bloating/discomfort.   Verdis Prime 04/28/2020, 8:26 AM   Patient is doing well.  Mobilize with PT today.

## 2020-04-28 NOTE — Progress Notes (Addendum)
  Progress Note    04/28/2020 7:50 AM 1 Day Post-Op  Subjective:  Complaining of right hip feeling stiff. Has not ambulated yet. Not tolerating diet due to some intermittent nausea   Vitals:   04/28/20 0040 04/28/20 0400  BP: 108/69 100/64  Pulse: 65 65  Resp: 16 15  Temp: 98 F (36.7 C) 99.1 F (37.3 C)  SpO2: 97% 96%   Physical Exam: Cardiac: regular Lungs: non labored Incisions: left lower quadrant incision is clean, dry and intact. No swelling. Expected tenderness Extremities:  2+ femoral pulses, popliteal and DP pulses bilaterally. Feet warm. Abdomen:  Soft, non tender, non distended Neurologic: alert and oriented  CBC    Component Value Date/Time   WBC 8.9 04/25/2020 1138   RBC 4.71 04/25/2020 1138   HGB 13.8 04/25/2020 1138   HCT 42.0 04/25/2020 1138   PLT 319 04/25/2020 1138   MCV 89.2 04/25/2020 1138   MCH 29.3 04/25/2020 1138   MCHC 32.9 04/25/2020 1138   RDW 14.6 04/25/2020 1138   LYMPHSABS 3.3 05/29/2011 0944   MONOABS 0.9 05/29/2011 0944   EOSABS 0.7 05/29/2011 0944   BASOSABS 0.1 05/29/2011 0944    BMET    Component Value Date/Time   NA 139 04/25/2020 1138   K 4.4 04/25/2020 1138   CL 106 04/25/2020 1138   CO2 26 04/25/2020 1138   GLUCOSE 99 04/25/2020 1138   BUN 20 04/25/2020 1138   CREATININE 1.14 (H) 04/25/2020 1138   CALCIUM 10.0 04/25/2020 1138   GFRNONAA 51 (L) 04/25/2020 1138   GFRAA 59 (L) 04/25/2020 1138    INR No results found for: INR   Intake/Output Summary (Last 24 hours) at 04/28/2020 0750 Last data filed at 04/28/2020 0500 Gross per 24 hour  Intake 3150 ml  Output 1885 ml  Net 1265 ml     Assessment/Plan:  65 y.o. female is s/p  Anterior exposure L5-S1 1 Day Post-Op. Doing well post op. Bilateral lower extremities well perfused. Palpable Femoral, popliteal and DP pulses bilaterally. Left lower quadrant incision is clean, dry and intact. Pain management and OOB and mobilize today. Doing well from vascular  standpoint  DVT prophylaxis:  SCDs   Karoline Caldwell, [facility] [phone] 04/28/2020 7:50 AM   I agree with the above.  I have seen and evaluated the patient.  She is postoperative day #1 from anterior exposure.  She is nauseous, however this is most likely related to anesthesia and should resolve with time.  She should advance her diet slowly.  Her incision is healing nicely.  Her abdomen is soft.  She has palpable pedal pulses.  I will be available as needed.  Dr. Scot Dock is on call this weekend should a vascular issue arise.  Annamarie Major

## 2020-04-28 NOTE — Evaluation (Signed)
Occupational Therapy Evaluation Patient Details Name: Tammy Booth MRN: [id number] DOB: 1956-09-09 Today's Date: 04/28/2020    History of Present Illness Pt underent lumbar five-Sacral one Anterior lumbar interbody fusion with Thoracic ten to Lumbar one Fixation, Sacral one to pelvis fixation on 8/19   Clinical Impression   This 64 yo female admitted and underwent above presents to acute OT with PLOF of being independent to Mod I with all of her basic ADLs pta with limitations due to pain. She currently is min guard A for all mobility in room and setup/S to Max A for basic ADLs. She will benefit from acute OT with follow up at SNF due to patient lives alone--but if she  progress more quickly then [facility] would be appropriate. We will continue to follow.    Follow Up Recommendations  SNF;Supervision/Assistance - 24 hour;Other (comment) (pt lives alone, if progresses well then [facility] may be an option)    Equipment Recommendations  None recommended by OT       Precautions / Restrictions Precautions Precautions: Back;Fall Precaution Booklet Issued: Yes (comment) Restrictions Weight Bearing Restrictions: No      Mobility Bed Mobility Overal bed mobility: Needs Assistance Bed Mobility: Rolling;Sidelying to Sit Rolling: Min guard Sidelying to sit: Min guard;HOB elevated       General bed mobility comments: bed ~20 degrees, use of rail and VCs for sequencing  Transfers Overall transfer level: Needs assistance Equipment used: Rolling walker (2 wheeled) Transfers: Sit to/from [employer] Sit to Stand: Min guard Stand pivot transfers: Min guard            Balance Overall balance assessment: Needs assistance Sitting-balance support: Feet supported;Bilateral upper extremity supported Sitting balance-Leahy Scale: Poor Sitting balance - Comments: needs Bil UE support due to pain in right hip   Standing balance support: Bilateral upper extremity supported Standing  balance-Leahy Scale: Poor                             ADL either performed or assessed with clinical judgement   ADL Overall ADL's : Needs assistance/impaired Eating/Feeding: Independent Eating/Feeding Details (indicate cue type and reason): supported sitting in recliner Grooming: Set up Grooming Details (indicate cue type and reason): supported sitting in recliner Upper Body Bathing: Set up Upper Body Bathing Details (indicate cue type and reason): supported sitting in recliner Lower Body Bathing: Moderate assistance Lower Body Bathing Details (indicate cue type and reason): min guard A sit<>stand Upper Body Dressing : Set up Upper Body Dressing Details (indicate cue type and reason): supported sitting in recliner Lower Body Dressing: Maximal assistance Lower Body Dressing Details (indicate cue type and reason): min guard A sit<>stand Toilet Transfer: Minimal assistance;Ambulation;RW Toilet Transfer Details (indicate cue type and reason): bed>recliner 3 feet away Toileting- Clothing Manipulation and Hygiene: Minimal assistance;Sit to/from stand               Vision Patient Visual Report: No change from baseline              Pertinent Vitals/Pain Pain Assessment: 0-10 Pain Score: 9  Pain Location: right hip ("feels like it is locked up") Pain Descriptors / Indicators: Aching;Tightness;Sore Pain Intervention(s): Limited activity within patient's tolerance;Monitored during session;Patient requesting pain meds-RN notified;RN gave pain meds during session;Heat applied     Hand Dominance Right   Extremity/Trunk Assessment Upper Extremity Assessment Upper Extremity Assessment: Overall WFL for tasks assessed  Communication Communication Communication: No difficulties   Cognition Arousal/Alertness: Awake/alert Behavior During Therapy: WFL for tasks assessed/performed Overall Cognitive Status: Within Functional Limits for tasks assessed                                                 Home Living Family/patient expects to be discharged to:: Private residence Living Arrangements: Alone Available Help at Discharge: Family;Available PRN/intermittently Type of Home: House Home Access: Stairs to enter Entrance Stairs-Number of Steps: 1   Home Layout: Two level Alternate Level Stairs-Number of Steps: 1--down into living room   Bathroom Shower/Tub: Walk-in shower;Door   [employer] Toilet: Standard     Home Equipment: Toilet riser;Walker - 2 wheels;Hand held shower head;Shower seat          Prior Functioning/Environment Level of Independence: Independent                 OT Problem List: Decreased range of motion;Impaired balance (sitting and/or standing);Decreased knowledge of precautions;Pain      OT Treatment/Interventions: Self-care/ADL training;DME and/or AE instruction;Patient/family education;Balance training    OT Goals(Current goals can be found in the care plan section) Acute Rehab OT Goals Patient Stated Goal: to be able to go home hopefully and not rehab OT Goal Formulation: With patient Time For Goal Achievement: 05/12/20 Potential to Achieve Goals: Good  OT Frequency: Min 2X/week   Barriers to D/C: Decreased caregiver support             AM-PAC OT "6 Clicks" Daily Activity     Outcome Measure Help from another person eating meals?: None Help from another person taking care of personal grooming?: A Little Help from another person toileting, which includes using toliet, bedpan, or urinal?: A Little Help from another person bathing (including washing, rinsing, drying)?: A Lot Help from another person to put on and taking off regular upper body clothing?: A Little Help from another person to put on and taking off regular lower body clothing?: A Lot 6 Click Score: 17   End of Session Equipment Utilized During Treatment: Rolling walker;Oxygen (2 liters (dropped from 94% to 90% on RA  with transfer once in recliner--O2 reapplied at 2 liters and gave pt inspirometer and had her return demonstrate how to use it with instruction to do 10 reps every hour (or 5 reps every 1/2 hour she is awake)) Nurse Communication:  (pt still with pain in right hip)  Activity Tolerance: Patient limited by pain Patient left: in chair;with call bell/phone within reach;with chair alarm set  OT Visit Diagnosis: Unsteadiness on feet (R26.81);Other abnormalities of gait and mobility (R26.89);Muscle weakness (generalized) (M62.81);Pain Pain - Right/Left: Right Pain - part of body: Hip                Time: 1610-9604 OT Time Calculation (min): 36 min Charges:  OT General Charges $OT Visit: 1 Visit OT Evaluation $OT Eval Moderate Complexity: 1 Mod OT Treatments $Self Care/Home Management : 8-22 mins  [address] [address], OTR/L Acute [employer] Pager [phone] Office [phone]    Almon Register 04/28/2020, 9:41 AM

## 2020-04-28 NOTE — [place] FL2 (Signed)
[place] MEDICAID FL2 LEVEL OF CARE SCREENING TOOL     IDENTIFICATION  Patient Name: Tammy Booth Birthdate: 1956-06-22 Sex: female Admission Date (Current Location): 04/27/2020  [facility] and [place] Number:  Herbalist and Address:  [facility] [place] [address] [place], [place] [address]      Provider Number: [id number]  Attending Physician Name and Address:  Erline Levine, MD  Relative Name and Phone Number:  [name], daughter    Current Level of Care: Hospital Recommended Level of Care: [place] Prior Approval Number:    Date Approved/Denied:   PASRR Number:    Discharge Plan: SNF    Current Diagnoses: Patient Active Problem List   Diagnosis Date Noted  . Idiopathic scoliosis of thoracolumbar region 04/27/2020    Orientation RESPIRATION BLADDER Height & Weight     Self, Place, Situation, Time  Normal Continent Weight: 132 lb (59.9 kg) Height:  5\' 5"  (165.1 cm)  BEHAVIORAL SYMPTOMS/MOOD NEUROLOGICAL BOWEL NUTRITION STATUS      Continent Diet (See discharge summary)  AMBULATORY STATUS COMMUNICATION OF NEEDS Skin   Limited Assist Verbally Other (Comment) (Closed incision, abdomen)                       Personal Care Assistance Level of Assistance  Bathing, Feeding, Dressing Bathing Assistance: Limited assistance Feeding assistance: Independent Dressing Assistance: Limited assistance     Functional Limitations Info  Sight, Hearing, Speech Sight Info: Adequate Hearing Info: Adequate Speech Info: Adequate    SPECIAL CARE FACTORS FREQUENCY  PT (By licensed PT), OT (By licensed OT)     PT Frequency: 5x a week OT Frequency: 5x a week            Contractures Contractures Info: Not present    Additional Factors Info  Code Status, Allergies Code Status Info: FULL Allergies Info: NKA           Current Medications (04/28/2020):  This is the current hospital active medication list Current  Facility-Administered Medications  Medication Dose Route Frequency Provider Last Rate Last Admin  . 0.9 %  sodium chloride infusion  250 mL Intravenous Continuous Erline Levine, MD 1 mL/hr at 04/27/20 2035 250 mL at 04/27/20 2035  . acetaminophen (TYLENOL) tablet 650 mg  650 mg Oral Q4H PRN Erline Levine, MD       Or  . acetaminophen (TYLENOL) suppository 650 mg  650 mg Rectal Q4H PRN Erline Levine, MD      . alum & mag hydroxide-simeth (MAALOX/MYLANTA) 200-200-20 MG/5ML suspension 30 mL  30 mL Oral Q6H PRN Erline Levine, MD      . bisacodyl (DULCOLAX) suppository 10 mg  10 mg Rectal Daily PRN Erline Levine, MD      . carvedilol (COREG) tablet 3.125 mg  3.125 mg Oral BID WC Erline Levine, MD   3.125 mg at 04/28/20 1608  . dextrose 5 % and 0.45 % NaCl with KCl 20 mEq/L infusion   Intravenous Continuous Erline Levine, MD 75 mL/hr at 04/27/20 2034 New Bag at 04/27/20 2034  . docusate sodium (COLACE) capsule 100 mg  100 mg Oral BID Erline Levine, MD   100 mg at 04/28/20 0901  . HYDROcodone-acetaminophen (NORCO/VICODIN) 5-325 MG per tablet 2 tablet  2 tablet Oral Q4H PRN Erline Levine, MD   2 tablet at 04/28/20 1428  . HYDROmorphone (DILAUDID) injection 0.5 mg  0.5 mg Intravenous Q2H PRN Erline Levine, MD   0.5  mg at 04/28/20 0859  . hydrOXYzine (ATARAX/VISTARIL) tablet 25 mg  25 mg Oral QHS PRN Erline Levine, MD      . levothyroxine (SYNTHROID) tablet 125 mcg  125 mcg Oral QAC breakfast Erline Levine, MD   125 mcg at 04/28/20 0521  . menthol-cetylpyridinium (CEPACOL) lozenge 3 mg  1 lozenge Oral PRN Erline Levine, MD       Or  . phenol [facility]) mouth spray 1 spray  1 spray Mouth/Throat PRN Erline Levine, MD      . methocarbamol (ROBAXIN) tablet 500 mg  500 mg Oral Q6H PRN Erline Levine, MD   500 mg at 04/28/20 1508   Or  . methocarbamol (ROBAXIN) 500 mg in dextrose 5 % 50 mL IVPB  500 mg Intravenous Q6H PRN Erline Levine, MD      . multivitamin with minerals tablet 1 tablet  1 tablet Oral  Daily Erline Levine, MD   1 tablet at 04/28/20 0901  . ondansetron (ZOFRAN) tablet 4 mg  4 mg Oral Q6H PRN Erline Levine, MD       Or  . ondansetron [facility]) injection 4 mg  4 mg Intravenous Q6H PRN Erline Levine, MD   4 mg at 04/27/20 2202  . oxyCODONE (Oxy IR/ROXICODONE) immediate release tablet 5 mg  5 mg Oral Q3H PRN Erline Levine, MD   5 mg at 04/28/20 1608  . pantoprazole (PROTONIX) EC tablet 40 mg  40 mg Oral QHS Erline Levine, MD      . polyethylene glycol [facility] / GLYCOLAX) packet 17 g  17 g Oral Daily PRN Erline Levine, MD      . sodium chloride flush (NS) 0.9 % injection 3 mL  3 mL Intravenous Q12H Erline Levine, MD   3 mL at 04/28/20 0901  . sodium chloride flush (NS) 0.9 % injection 3 mL  3 mL Intravenous PRN Erline Levine, MD      . sodium phosphate (FLEET) 7-19 GM/118ML enema 1 enema  1 enema Rectal Once PRN Erline Levine, MD      . zolpidem [facility]) tablet 5 mg  5 mg Oral QHS PRN Erline Levine, MD         Discharge Medications: Please see discharge summary for a list of discharge medications.  Relevant Imaging Results:  Relevant Lab Results:   Additional Information SSN [id number]  Waylan Boga, [place]

## 2020-04-28 NOTE — TOC Initial Note (Addendum)
Transition of Care [facility]) - Initial/Assessment Note    Patient Details  Name: Tammy Booth MRN: [id number] Date of Birth: Aug 30, 1956  [name] CM/SW Contact:    Varney Baas Phone Number: 04/28/2020, 4:37 PM  Clinical Narrative:                 CSW met with patient regarding PT recommendation for short-term SNF placement at discharge. Patient expressed understanding of recommendation and is in agreement. Patient expressed preference for a SNF near [place], naming [facility].  Patient stated prior to admission, she was independent with ambulation and all ADLs. She lives in a single story home with one-step entry. Patient received her first moderna vaccine and is scheduled to receive the second dose on Sept 2nd. Patient provided permission for her daughter Tammy Booth Comment to be contacted if needed.  CSW contacted Marvetta Gibbons [phone], faxed referral to admission contact Verner Mould fax [phone].  Expected Discharge Plan: Skilled Nursing Facility Barriers to Discharge: Continued Medical Work up, Ship broker   Patient Goals and CMS Choice   CMS Medicare.gov Compare Post Acute Care list provided to:: Patient Choice offered to / list presented to : Patient  Expected Discharge Plan and Services Expected Discharge Plan: [place] Choice: [place] arrangements for the past 2 months: Single Family Home                                      Prior Living Arrangements/Services Living arrangements for the past 2 months: Single Family Home Lives with:: Self Patient language and need for interpreter reviewed:: Yes Do you feel safe going back to the place where you live?: Yes      Need for Family Participation in Patient Care: No (Comment) Care giver support system in place?: Yes (comment)   Criminal Activity/Legal Involvement Pertinent to Current  Situation/Hospitalization: No - Comment as needed  Activities of Daily Living      Permission Sought/Granted Permission sought to share information with : Facility Sport and exercise psychologist, Family Supports Permission granted to share information with : Yes, Verbal Permission Granted  Share Information with NAME: [name]  Permission granted to share info w AGENCY: SNFs  Permission granted to share info w Relationship: Daughter  Permission granted to share info w Contact Information: [phone]  Emotional Assessment Appearance:: Appears stated age Attitude/Demeanor/Rapport: Engaged Affect (typically observed): Appropriate Orientation: : Oriented to Situation, Oriented to  Time, Oriented to Place, Oriented to Self Alcohol / Substance Use: Not Applicable Psych Involvement: No (comment)  Admission diagnosis:  Idiopathic scoliosis of thoracolumbar region [M41.25] Patient Active Problem List   Diagnosis Date Noted  . Idiopathic scoliosis of thoracolumbar region 04/27/2020   PCP:  [name], MD Pharmacy:   [id number] [place] [place]Phone: [phone]: [phone] [id number] [place], [place]one: [phone]: [phone]   Social Determinants of Health (SDOH) Interventions    Readmission Risk Interventions No flowsheet data found.

## 2020-04-29 NOTE — Progress Notes (Signed)
Patient ID: Tammy Booth, female   DOB: 16-Jul-1956, 64 y.o.   MRN: [id number] Alert and oriented x 4 Moving lower extremities well Wound is clean Awaiting placement In expected pain.

## 2020-04-29 NOTE — Progress Notes (Signed)
Physical Therapy Treatment Patient Details Name: Tammy Booth MRN: [id number] DOB: February 26, 1956 Today's Date: 04/29/2020    History of Present Illness Pt underent lumbar five-Sacral one Anterior lumbar interbody fusion with Thoracic ten to Lumbar one Fixation, Sacral one to pelvis fixation on 8/19    PT Comments    Pt on 4L O2 on arrival with SpO2 95%. Session limited to transfers only due to pt unable to maintain adequate SpO2 during mobility. Desat to 82% during mobility. Improved to 90% after 3-minute seated rest in recliner. Fluctuating 88-92% in recliner at end of session. She required min assist bed mobility, min assist sit to stand, and min guard assist ambulation 3' with RW. Pt dependent with donning brace EOB. Pt reports she has not eaten since sx due to nausea. She feels this is adding to her weakness.     Follow Up Recommendations  SNF     Equipment Recommendations  Rolling walker with 5" wheels;3in1 (PT)    Recommendations for Other Services       Precautions / Restrictions Precautions Precautions: Fall;Back Precaution Comments: reviewed back precautions Required Braces or Orthoses: Spinal Brace Spinal Brace: Applied in sitting position;Thoracolumbosacral orthotic    Mobility  Bed Mobility Overal bed mobility: Needs Assistance Bed Mobility: Rolling;Sidelying to Sit Rolling: Min guard Sidelying to sit: Min assist       General bed mobility comments: +rail, cues for sequencing, assist to elevate trunk  Transfers Overall transfer level: Needs assistance Equipment used: Rolling walker (2 wheeled) Transfers: Sit to/from Stand Sit to Stand: Min assist         General transfer comment: cues for hand placement, assist to power up  Ambulation/Gait Ambulation/Gait assistance: Min guard Gait Distance (Feet): 3 Feet Assistive device: Rolling walker (2 wheeled) Gait Pattern/deviations: Step-through pattern;Decreased stride length     General Gait Details:  Limited distance due to [employer] Mobility    Modified Rankin (Stroke Patients Only)       Balance Overall balance assessment: Needs assistance Sitting-balance support: Feet supported;Bilateral upper extremity supported Sitting balance-Leahy Scale: Fair     Standing balance support: During functional activity;Bilateral upper extremity supported Standing balance-Leahy Scale: Poor Standing balance comment: reliant on BUE support                            Cognition Arousal/Alertness: Awake/alert Behavior During Therapy: WFL for tasks assessed/performed Overall Cognitive Status: Within Functional Limits for tasks assessed                                        Exercises      General Comments General comments (skin integrity, edema, etc.): Pt on 4L continuous O2. SpO2 95% at rest prior to mobility. Desat to 82% during mobility. 90% after 3-minute seated recovery in recliner. SpO2 flucuating 88-92% on 4L in recliner at end of session. RN aware.      Pertinent Vitals/Pain Pain Assessment: Faces Faces Pain Scale: Hurts even more Pain Location: RLE Pain Descriptors / Indicators: Aching;Grimacing;Guarding;Discomfort Pain Intervention(s): Limited activity within patient's tolerance;Repositioned    Home Living                      Prior Function  PT Goals (current goals can now be found in the care plan section) Acute Rehab PT Goals Patient Stated Goal: home Progress towards PT goals: Progressing toward goals    Frequency    Min 4X/week      PT Plan Current plan remains appropriate    Co-evaluation              AM-PAC PT "6 Clicks" Mobility   Outcome Measure  Help needed turning from your back to your side while in a flat bed without using bedrails?: A Little Help needed moving from lying on your back to sitting on the side of a flat bed without using bedrails?: A  Little Help needed moving to and from a bed to a chair (including a wheelchair)?: A Little Help needed standing up from a chair using your arms (e.g., wheelchair or bedside chair)?: A Little Help needed to walk in hospital room?: A Little Help needed climbing 3-5 steps with a railing? : A Lot 6 Click Score: 17    End of Session Equipment Utilized During Treatment: Back brace;Gait belt Activity Tolerance: Treatment limited secondary to medical complications (Comment) (desat) Patient left: in chair;with call bell/phone within reach Nurse Communication: Mobility status PT Visit Diagnosis: Other abnormalities of gait and mobility (R26.89);Muscle weakness (generalized) (M62.81);Pain     Time: 2409-7353 PT Time Calculation (min) (ACUTE ONLY): 18 min  Charges:  $Gait Training: 8-22 mins                     Lorrin Goodell, [place]  Office # [phone] Pager [phone]    Lorriane Shire 04/29/2020, 11:07 AM

## 2020-04-29 NOTE — Plan of Care (Signed)

## 2020-04-30 NOTE — Progress Notes (Signed)
Occupational Therapy Treatment Patient Details Name: Tammy Booth MRN: [id number] DOB: 1956-05-16 Today's Date: 04/30/2020    History of present illness Pt underent lumbar five-Sacral one Anterior lumbar interbody fusion with Thoracic ten to Lumbar one Fixation, Sacral one to pelvis fixation on 8/19   OT comments  Pt. Seen for skilled OT treatment session. Pt. Agreeable to participation and eager for oob to eat breakfast.  Bed mobility S with good log roll technique. Donned brace min/mod a seated.  Stand pivot to bsc and recliner min a.  Pt. With willingness and effort for participation but remains limited with o2 fluctuations.  See below for further details.  O2 details from therapy session:  -3 liters nasal canula at beginning of session. -Supine to sit: 82-84% unable to bring to desired range with rest break and PLB -RN adjusted to 4 liters, then 4.5. pt. Then pivoted to bsc -Pt. Still unable to regain desired o2 levels with o2 hoovering 84 then dropping to 74% -RN then applied non rebreather mask, the non rebreather elicited notable anxiety and pt. Began crying "please dont put me on that, please dont put me on that my mother died with one of these on". -RN and myself provided emotional support and explained it would be short term to get o2 back to desired range as PLB was not effective and pt. Was reporting dizziness from her attempts. -Pt. Slowly rebounding to 84-89, then ultimately 92% -RN Returned pt. To nasal canula as RN had promised.      Follow Up Recommendations  SNF;Supervision/Assistance - 24 hour;Other (comment)    Equipment Recommendations  None recommended by OT    Recommendations for Other Services      Precautions / Restrictions Precautions Precautions: Fall;Back Required Braces or Orthoses: Spinal Brace Spinal Brace: Applied in sitting position;Thoracolumbosacral orthotic Restrictions Other Position/Activity Restrictions: watch o2       Mobility Bed  Mobility Overal bed mobility: Needs Assistance Bed Mobility: Rolling;Sidelying to Sit Rolling: Supervision Sidelying to sit: Supervision       General bed mobility comments: good log roll technique with intermittent instructional cues required  Transfers Overall transfer level: Needs assistance Equipment used: None Transfers: Stand Pivot Transfers   Stand pivot transfers: Min assist       General transfer comment: cues for hand placement during pivot eob to bsc and bsc to recliner. once educated on hand placement pt. demonstrated good carryover with hand placement for the 2nd transfer    Balance                                           ADL either performed or assessed with clinical judgement   ADL Overall ADL's : Needs assistance/impaired                 Upper Body Dressing : Minimal assistance;Moderate assistance;Sitting Upper Body Dressing Details (indicate cue type and reason): don brace     Toilet Transfer: Minimal assistance;Stand-pivot;BSC Toilet Transfer Details (indicate cue type and reason): cues for hand placement for safe pivot, initally attempting with each hand on arm rest facing commode, educated on reaching to set up for a safe pivot Toileting- Clothing Manipulation and Hygiene: Set up;Sitting/lateral lean       Functional mobility during ADLs: Minimal assistance General ADL Comments: educated on safe pivot transfer. good recall for when she transferred from bsc to recliner she  stated "i put my hand this way".  limited by fluctuations and decreased o2 sats with any activity. long rest breaks/recovery periods to resume safe range. pt. with difficulty with consistent PLB as she says "i dont feel bad, i feel fine"     Vision       Perception     Praxis      Cognition Arousal/Alertness: Awake/alert Behavior During Therapy: WFL for tasks assessed/performed Overall Cognitive Status: Within Functional Limits for tasks assessed                                  General Comments: brief period of increased anxiety when rn had to transfer pt. to non rebreather mask. pt. became tearful and begging rn "please, please dont put that on my my mother died with one of those on". emotional support provided and was able to calm pt. to tolerate the mask for a brief period of time        Exercises     Shoulder Instructions       General Comments      Pertinent Vitals/ Pain       Pain Assessment: No/denies pain  Home Living                                          Prior Functioning/Environment              Frequency  Min 2X/week        Progress Toward Goals  OT Goals(current goals can now be found in the care plan section)  Progress towards OT goals: Progressing toward goals     Plan Discharge plan remains appropriate    Co-evaluation                 AM-PAC OT "6 Clicks" Daily Activity     Outcome Measure   Help from another person eating meals?: None Help from another person taking care of personal grooming?: A Little Help from another person toileting, which includes using toliet, bedpan, or urinal?: A Little Help from another person bathing (including washing, rinsing, drying)?: A Lot Help from another person to put on and taking off regular upper body clothing?: A Little Help from another person to put on and taking off regular lower body clothing?: A Lot 6 Click Score: 17    End of Session Equipment Utilized During Treatment: Oxygen  OT Visit Diagnosis: Unsteadiness on feet (R26.81);Other abnormalities of gait and mobility (R26.89);Muscle weakness (generalized) (M62.81);Pain Pain - Right/Left: Right Pain - part of body: Hip   Activity Tolerance Other (comment) (limited by o2 levels)   Patient Left in chair;with call bell/phone within reach   Nurse Communication Other (comment) (rn present for entire session assisting with o2 fluctations and trouble  shooting to rebound pt. to safe level of o2 sats)        Time: 3536-1443 OT Time Calculation (min): 31 min  Charges: OT General Charges $OT Visit: 1 Visit OT Treatments $Self Care/Home Management : 23-37 mins  Sonia Baller, COTA/L Acute Rehabilitation [phone]   Janice Coffin 04/30/2020, 9:59 AM

## 2020-04-30 NOTE — Progress Notes (Signed)
Patient ID: Tammy Booth, female   DOB: [birth year], 64 y.o.   MRN: [id number]  BP 130/66    Pulse 86    Temp 99.1 F (37.3 C) (Oral)    Resp 16    Ht 5\' 5"  (1.651 m)    Wt 59.9 kg    SpO2 93%    BMI 21.97 kg/m  Alert and oriented x4 Moving all extremities Continuing with therapy Awaiting placement

## 2020-05-01 NOTE — Progress Notes (Signed)
   Providing Compassionate, Quality Care - Together  NEUROSURGERY PROGRESS NOTE   S: No issues overnight. Pain controlled, + flatus  O: EXAM:  BP 99/69 (BP Location: Left Arm)   Pulse 81   Temp 98.9 F (37.2 C) (Oral)   Resp 14   Ht 5\' 5"  (1.651 m)   Wt 59.9 kg   SpO2 99%   BMI 21.97 kg/m   Awake, alert, oriented  Speech fluent, appropriate  CN grossly intact  5/5 BUE/BLE  Incisions c/d/i abd soft  ASSESSMENT:  64 y.o. female with  Scoliosis, s/p T10-pelvis fusion, L5-S1 ALIF   PLAN: - snf pending -bowel regimen -pain control -pt/ot    Thank you for allowing me to participate in this patient's care.  Please do not hesitate to call with questions or concerns.   Elwin Sleight, [facility] Cell: [phone]

## 2020-05-01 NOTE — TOC Progression Note (Addendum)
Transition of Care [facility]) - Progression Note    Patient Details  Name: Tammy Booth MRN: [id number] Date of Birth: [birth year]  Transition of [facility]) CM/SW [place], [place] Phone Number: 05/01/2020, 12:03 PM  Clinical Narrative:     CSW contacted [facility] spoke with admissions Margreta Journey, she informed they have no availability until Friday.  [place] Left voice message [facility]- they will review and call CSW back [place]- left voice message ( correct number [phone])  CSW will continue to follow and assist with discharge planning.  Thurmond Butts, MSW, [place] Clinical Social Worker    Expected Discharge Plan: Skilled Nursing Facility Barriers to Discharge: Continued Medical Work up, Ship broker  Expected Discharge Plan and Services Expected Discharge Plan: [place] Choice: [place] arrangements for the past 2 months: Single Family Home                                       Social Determinants of Health (SDOH) Interventions    Readmission Risk Interventions No flowsheet data found.

## 2020-05-01 NOTE — Progress Notes (Signed)
Physical Therapy Treatment Patient Details Name: Tammy Booth MRN: [id number] DOB: 12/14/1955 Today's Date: 05/01/2020    History of Present Illness Pt underent lumbar five-Sacral one Anterior lumbar interbody fusion with Thoracic ten to Lumbar one Fixation, Sacral one to pelvis fixation on 8/19    PT Comments    Patient progressing back to hallway ambulation, but with significant R LE pain initiated with coughing during ambulation.  Able to recover with seated rest and return to the room.  She has productive cough, but still has significant desaturation even on 3-4L O2.  Patient remains appropriate for SNF level rehab as would not have 24 hour assist at d/c.     Follow Up Recommendations  SNF     Equipment Recommendations  Rolling walker with 5" wheels;3in1 (PT)    Recommendations for Other Services       Precautions / Restrictions Precautions Precautions: Fall;Back Required Braces or Orthoses: Spinal Brace Spinal Brace: Applied in sitting position;Thoracolumbosacral orthotic Restrictions Other Position/Activity Restrictions: watch o2    Mobility  Bed Mobility Overal bed mobility: Needs Assistance           Sit to sidelying: Supervision General bed mobility comments: cues and pt able to state technique while performing back to bed  Transfers Overall transfer level: Needs assistance Equipment used: Rolling walker (2 wheeled) Transfers: Sit to/from Stand   Stand pivot transfers: Min assist       General transfer comment: up from recliner with assist for balance, steadying due to pain, coughing at times  Ambulation/Gait Ambulation/Gait assistance: Min guard Gait Distance (Feet): 75 Feet (x 2) Assistive device: Rolling walker (2 wheeled) Gait Pattern/deviations: Step-through pattern;Decreased stride length;Trunk flexed;Decreased step length - right     General Gait Details: reports R leg "feels funny" since surgery, noted some ataxia at times with placement,  and some buckling, maintained on 3-4L O2 with ambulation SpO2 86%during seated rest, cues for pursed lip breathing, back to 91% after ambulation and back on wall O2.   Stairs             Wheelchair Mobility    Modified Rankin (Stroke Patients Only)       Balance Overall balance assessment: Needs assistance Sitting-balance support: Feet supported;No upper extremity supported Sitting balance-Tammy Booth: Fair     Standing balance support: During functional activity;Bilateral upper extremity supported Standing balance-Tammy Booth: Poor Standing balance comment: reliant on BUE support                            Cognition Arousal/Alertness: Awake/alert Behavior During Therapy: WFL for tasks assessed/performed Overall Cognitive Status: Within Functional Limits for tasks assessed                                        Exercises      General Comments        Pertinent Vitals/Pain Pain Assessment: Faces Faces Pain Booth: Hurts even more Pain Location: RLE Pain Descriptors / Indicators: Aching;Grimacing;Guarding;Discomfort Pain Intervention(s): Limited activity within patient's tolerance;Monitored during session;Repositioned    Home Living                      Prior Function            PT Goals (current goals can now be found in the care plan section) Progress towards PT goals: Progressing  toward goals    Frequency    Min 4X/week      PT Plan Current plan remains appropriate    Co-evaluation              AM-PAC PT "6 Clicks" Mobility   Outcome Measure  Help needed turning from your back to your side while in a flat bed without using bedrails?: A Little Help needed moving from lying on your back to sitting on the side of a flat bed without using bedrails?: A Little Help needed moving to and from a bed to a chair (including a wheelchair)?: A Little   Help needed to walk in hospital room?: A Little Help needed  climbing 3-5 steps with a railing? : A Lot 6 Click Score: 14    End of Session Equipment Utilized During Treatment: Back brace Activity Tolerance: Patient limited by pain Patient left: in bed;with call bell/phone within reach   PT Visit Diagnosis: Other abnormalities of gait and mobility (R26.89);Muscle weakness (generalized) (M62.81);Pain Pain - Right/Left: Right Pain - part of body: Hip     Time: 4970-2637 PT Time Calculation (min) (ACUTE ONLY): 14 min  Charges:  $Gait Training: 8-22 mins                     Magda Kiel, PT Acute Rehabilitation Services Pager:[phone] Office:[phone] 05/01/2020    Reginia Naas 05/01/2020, 11:27 AM

## 2020-05-02 LAB — SARS CORONAVIRUS 2 BY RT PCR (HOSPITAL ORDER, PERFORMED IN ~~LOC~~ HOSPITAL LAB): SARS Coronavirus 2: NEGATIVE

## 2020-05-02 NOTE — TOC Progression Note (Signed)
Transition of Care [facility]) - Progression Note    Patient Details  Name: Tammy Booth MRN: [id number] Date of Birth: December 14, 1955  Transition of Care [facility]) CM/SW [place], [place] Phone Number: 05/02/2020, 1:16 PM  Clinical Narrative:     Patient has SNF authorization- informed RN covid test is needed before she can discharge to SNF.  Per chart review it appears patient is medically stable- patient's daughter scheduled to pick up patient and transport in private vehicle tomorrow to [facility] tomorrow at 1:00pm, if MD clears for discharge.  CSW will contact patient's daughter again tomorrow to confirm discharge plan.   [facility]- they confirmed patient can arrive by private vehicle.  Thurmond Butts, MSW, [place] Clinical Social Worker   Expected Discharge Plan: Skilled Nursing Facility Barriers to Discharge: Continued Medical Work up, Ship broker  Expected Discharge Plan and Services Expected Discharge Plan: [place] Choice: [place] arrangements for the past 2 months: Single Family Home                                       Social Determinants of Health (SDOH) Interventions    Readmission Risk Interventions No flowsheet data found.

## 2020-05-02 NOTE — TOC Progression Note (Signed)
Transition of Care [facility]) - Progression Note    Patient Details  Name: Tammy Booth MRN: [id number] Date of Birth: [birth year]  Transition of Care [facility]) CM/SW [place], [place] Phone Number: 05/02/2020, 10:41 AM  Clinical Narrative:     CSW  Visit with patient at bedside, (daughter on speaker phone). CSW introduced self and explained role.  CSW discussed PT recommendations of short term rehab at [facility]. Patient states she is agreeable to SNF and accepted bed offer with [facility]. Patient's daughter,[name], states she volunteers for fire department and will arrange non emergency transportation for patient w/[place]. Patient's daughter will need to be notified as soon as possible when she is medically stable.  CSW spoke with [facility] and confirmed bed offer.   CSW will continue to follow and assist with discharge planning.  Thurmond Butts, MSW, [place] Clinical Social Worker    Expected Discharge Plan: Skilled Nursing Facility Barriers to Discharge: Continued Medical Work up, Ship broker  Expected Discharge Plan and Services Expected Discharge Plan: [place] Choice: [place] arrangements for the past 2 months: Single Family Home                                       Social Determinants of Health (SDOH) Interventions    Readmission Risk Interventions No flowsheet data found.

## 2020-05-02 NOTE — Progress Notes (Signed)
   Providing Compassionate, Quality Care - Together  NEUROSURGERY PROGRESS NOTE   S: No issues overnight. Pain more controlled, ambulated yesterday  O: EXAM:  BP 103/68 (BP Location: Left Arm)   Pulse 72   Temp 98.5 F (36.9 C) (Oral)   Resp 16   Ht 5\' 5"  (1.651 m)   Wt 59.9 kg   SpO2 99%   BMI 21.97 kg/m   Awake, alert, oriented  Speech fluent, appropriate  CNs grossly intact  5/5 BUE/BLE  abd soft  ASSESSMENT:  64 y.o. female with  Scoliosis, s/p T10-pelvis fusion, L5-S1 ALIF   PLAN: -snf pending acceptance -bowel regimen -pain control -pt/ot -scds   Thank you for allowing me to participate in this patient's care.  Please do not hesitate to call with questions or concerns.   Elwin Sleight, [facility] Cell: [phone]

## 2020-05-02 NOTE — TOC Progression Note (Addendum)
Transition of Care [facility]) - Progression Note    Patient Details  Name: Tammy Booth MRN: [id number] Date of Birth: 27-Oct-1955  Transition of Care [facility]) CM/SW [place], [place] Phone Number: 05/02/2020, 10:51 AM  Clinical Narrative:     CSW sent updated PT notes- [facility] will start authorization today.  RN updated- needs covid test   Thurmond Butts, MSW, [place] Clinical Social Worker    Expected Discharge Plan: Skilled Nursing Facility Barriers to Discharge: Continued Medical Work up, Ship broker  Expected Discharge Plan and Services Expected Discharge Plan: [place] Choice: [place] arrangements for the past 2 months: Single Family Home                                       Social Determinants of Health (SDOH) Interventions    Readmission Risk Interventions No flowsheet data found.

## 2020-05-03 MED ORDER — METHOCARBAMOL 500 MG PO TABS
500.0000 mg | ORAL_TABLET | Freq: Three times a day (TID) | ORAL | 0 refills | Status: DC | PRN
Start: 2020-05-03 — End: 2020-05-03

## 2020-05-03 MED ORDER — HYDROCODONE-ACETAMINOPHEN 5-325 MG PO TABS
2.0000 | ORAL_TABLET | Freq: Four times a day (QID) | ORAL | 0 refills | Status: DC | PRN
Start: 2020-05-03 — End: 2020-05-03

## 2020-05-03 MED ORDER — METHOCARBAMOL 500 MG PO TABS: 500.0000 mg | ORAL_TABLET | Freq: Three times a day (TID) | ORAL | 0 refills | Status: DC | PRN

## 2020-05-03 MED ORDER — HYDROCODONE-ACETAMINOPHEN 5-325 MG PO TABS: 2.0000 | ORAL_TABLET | Freq: Four times a day (QID) | ORAL | 0 refills | Status: DC | PRN

## 2020-05-03 MED ORDER — ONDANSETRON 4 MG PO TBDP
4.0000 mg | ORAL_TABLET | Freq: Four times a day (QID) | ORAL | Status: DC | PRN
  Administered 2020-05-03: 4 mg via ORAL

## 2020-05-03 MED ORDER — ONDANSETRON 4 MG PO TBDP
4.0000 mg | ORAL_TABLET | Freq: Once | ORAL | Status: AC
  Administered 2020-05-03: 4 mg via ORAL
  Filled 2020-05-03: qty 1

## 2020-05-03 MED FILL — Heparin Sodium (Porcine) Inj 1000 Unit/ML: INTRAMUSCULAR | Qty: 30 | Status: AC

## 2020-05-03 MED FILL — Sodium Chloride IV Soln 0.9%: INTRAVENOUS | Qty: 1000 | Status: AC

## 2020-05-03 NOTE — TOC Transition Note (Signed)
Transition of Care [facility]) - CM/SW Discharge Note   Patient Details  Name: Tammy Booth MRN: [id number] Date of Birth: 02-Jan-1956  Transition of Care [facility]) CM/SW Contact:  Vinie Sill, [place] Phone Number: 05/03/2020, 12:40 PM   Clinical Narrative:     Patient will DC to: [place] Date: 05/03/2020 Family Notified: [name],daughter Transport [id number], car    Per MD patient is ready for discharge. RN, patient, and facility notified of DC. Discharge Summary sent to facility. RN given number for [phone].  Clinical Social Worker signing off.  Thurmond Butts, MSW, LCSWA Clinical Social Worker   Final next level of care: Skilled Nursing Facility Barriers to Discharge: Barriers Resolved   Patient Goals and CMS Choice   CMS Medicare.gov Compare Post Acute Care list provided to:: Patient Choice offered to / list presented to : Patient  Discharge Placement              Patient chooses bed at: [facility] Patient to be transferred to facility by: daughter- car Name of family member notified: daughter, [name] Comment Patient and family notified of of transfer: 05/03/20  Discharge Plan and Services     Post Acute Care Choice: [place]                               Social Determinants of Health (SDOH) Interventions     Readmission Risk Interventions No flowsheet data found.

## 2020-05-03 NOTE — Progress Notes (Signed)
Called [phone] and report given to Arvid Right, [place] receiving Nurse. Questions answered and concerns clarified. Provided call back number if any additional questions. D/C instructions 2 copies printed; 1 remitted to patient and other one being sent to the Facility. Awaiting patient's daughter to transfer out. Elita Boone, BSN, RN

## 2020-05-03 NOTE — Progress Notes (Signed)
Have wean patient o2 from 4L [place] from the start of shift to room air towards the end of shift. Pt is staying between 92-95% on room air while sleeping.     Sharin Mons, RN

## 2020-05-03 NOTE — Plan of Care (Signed)
  Problem: Education: Goal: Knowledge of General Education information will improve Description: Including pain rating scale, medication(s)/side effects and non-pharmacologic comfort measures Outcome: Adequate for Discharge   Problem: Health Behavior/Discharge Planning: Goal: Ability to manage health-related needs will improve Outcome: Adequate for Discharge   Problem: Clinical Measurements: Goal: Ability to maintain clinical measurements within normal limits will improve Outcome: Adequate for Discharge   Problem: Activity: Goal: Risk for activity intolerance will decrease Outcome: Adequate for Discharge   Problem: Clinical Measurements: Goal: Will remain free from infection Outcome: Completed/Met Goal: Diagnostic test results will improve Outcome: Completed/Met Goal: Respiratory complications will improve Outcome: Completed/Met Goal: Cardiovascular complication will be avoided Outcome: Completed/Met   Problem: Nutrition: Goal: Adequate nutrition will be maintained Outcome: Completed/Met   Problem: Coping: Goal: Level of anxiety will decrease Outcome: Completed/Met   Problem: Elimination: Goal: Will not experience complications related to bowel motility Outcome: Completed/Met Goal: Will not experience complications related to urinary retention Outcome: Completed/Met   Problem: Pain Managment: Goal: General experience of comfort will improve Outcome: Completed/Met   Problem: Safety: Goal: Ability to remain free from injury will improve Outcome: Completed/Met   Problem: Skin Integrity: Goal: Risk for impaired skin integrity will decrease Outcome: Completed/Met

## 2020-05-03 NOTE — Progress Notes (Signed)
Called Dr. Reatha Armour regarding Discharge Summary and narcotic scripts to the [place]. States will be done shortly. Elita Boone, BSN, RN

## 2020-05-03 NOTE — Progress Notes (Signed)
Physical Therapy Treatment Patient Details Name: Tammy Booth MRN: [id number] DOB: 03-10-1956 Today's Date: 05/03/2020    History of Present Illness Pt underent lumbar five-Sacral one Anterior lumbar interbody fusion with Thoracic ten to Lumbar one Fixation, Sacral one to pelvis fixation on 8/19    PT Comments    Patient progressing with ambulation noting improved R LE coordination this session.  Patient still weak and with limited activity tolerance feels R LE buckling at times.  Remains appropriate for SNF level rehab prior to home alone.     Follow Up Recommendations  SNF     Equipment Recommendations  Rolling walker with 5" wheels;3in1 (PT)    Recommendations for Other Services       Precautions / Restrictions Precautions Precautions: Fall;Back Required Braces or Orthoses: Spinal Brace Spinal Brace: Applied in sitting position;Thoracolumbosacral orthotic Restrictions Other Position/Activity Restrictions: watch o2    Mobility  Bed Mobility Overal bed mobility: Needs Assistance Bed Mobility: Rolling;Sidelying to Sit Rolling: Supervision Sidelying to sit: Supervision          Transfers Overall transfer level: Needs assistance Equipment used: Rolling walker (2 wheeled) Transfers: Sit to/from Stand Sit to Stand: Min assist Stand pivot transfers: Min guard       General transfer comment: assist for balance as reports feeling light headed/dizzy; BP supine/sitting WNL  Ambulation/Gait Ambulation/Gait assistance: Min guard Gait Distance (Feet): 150 Feet (x 2) Assistive device: Rolling walker (2 wheeled) Gait Pattern/deviations: Step-through pattern;Decreased stride length;Decreased dorsiflexion - right     General Gait Details: occasional buckling on R, but noted improved placement/coordination during gait; SpO2 87% with ambulation on RA, back to 92% on 2L O2 at rest   Stairs             Wheelchair Mobility    Modified Rankin (Stroke Patients  Only)       Balance Overall balance assessment: Needs assistance Sitting-balance support: Feet supported;No upper extremity supported Sitting balance-Leahy Scale: Good     Standing balance support: Bilateral upper extremity supported Standing balance-Leahy Scale: Poor Standing balance comment: reliant on BUE support                            Cognition Arousal/Alertness: Awake/alert Behavior During Therapy: WFL for tasks assessed/performed Overall Cognitive Status: Within Functional Limits for tasks assessed                                        Exercises      General Comments        Pertinent Vitals/Pain Pain Assessment: Faces Faces Pain Scale: Hurts little more Pain Location: RLE Pain Descriptors / Indicators: Tingling Pain Intervention(s): Monitored during session;Repositioned    Home Living                      Prior Function            PT Goals (current goals can now be found in the care plan section) Progress towards PT goals: Progressing toward goals    Frequency    Min 4X/week      PT Plan Current plan remains appropriate    Co-evaluation              AM-PAC PT "6 Clicks" Mobility   Outcome Measure  Help needed turning from your back to your side while in  a flat bed without using bedrails?: None Help needed moving from lying on your back to sitting on the side of a flat bed without using bedrails?: None Help needed moving to and from a bed to a chair (including a wheelchair)?: A Little Help needed standing up from a chair using your arms (e.g., wheelchair or bedside chair)?: A Little Help needed to walk in hospital room?: A Little Help needed climbing 3-5 steps with a railing? : A Lot 6 Click Score: 19    End of Session Equipment Utilized During Treatment: Back brace   Patient left: in chair;with call bell/phone within reach   PT Visit Diagnosis: Other abnormalities of gait and mobility  (R26.89);Muscle weakness (generalized) (M62.81);Pain Pain - Right/Left: Right Pain - part of body: Leg     Time: 4445-8483 PT Time Calculation (min) (ACUTE ONLY): 32 min  Charges:  $Gait Training: 8-22 mins $Therapeutic Activity: 8-22 mins                     Magda Kiel, PT Acute Rehabilitation Services Pager:[phone] [phone] 05/03/2020    Reginia Naas 05/03/2020, 1:11 PM

## 2020-05-03 NOTE — Discharge Summary (Signed)
  Physician Discharge Summary  Patient ID: Tammy Booth MRN: [id number] DOB/AGE: March 26, 1956 64 y.o.  Admit date: 04/27/2020 Discharge date: 05/03/2020  Admission Diagnoses:  Other idiopathic scoliosis, thoracoumbar region, kyphosis, lumbar foraminal stenosis, lumbago, radiculopathy   Discharge Diagnoses:  Same Active Problems:   Idiopathic scoliosis of thoracolumbar region   Discharged Condition: Stable  Hospital Course:  Tammy Booth is a 64 y.o. female who underwent the below surgery and tolerated the surgery well.  Postoperatively she was evaluated by PT OT in which they recommended she go to a skilled nursing facility.  Her pain was controlled on oral medication throughout her hospitalization.  She was having normal bowel and bladder function.  She was at her neurologic baseline upon discharge.  Treatments: Surgery - Lumbar five-Sacral one Anterior lumbar interbody fusion with Thoracic ten to Lumbar one Fixation, Sacral one to pelvis fixation, exploration of previous fusion (N/A) thoracic ten to lumbar one fixation ,sacral one to pelvis fixation (N/A) APPLICATION OF INTRAOPERATIVE CT SCAN (N/A) ABDOMINAL EXPOSURE FOR LUMBAR FIVE SACRAL ONE (N/A) posterolateral arthrodesis  Discharge Exam: Blood pressure 111/76, pulse 88, temperature 98.3 F (36.8 C), temperature source Oral, resp. rate 18, height 5\' 5"  (1.651 m), weight 59.9 kg, SpO2 94 %. Awake, alert, oriented Speech fluent, appropriate CN grossly intact 5/5 BUE/BLE Wound c/d/i  Disposition: Discharge disposition: 03-Skilled Nursing Facility        Allergies as of 05/03/2020   No Known Allergies     Medication List    STOP taking these medications   hydrOXYzine 25 MG tablet Commonly known as: ATARAX/VISTARIL   ibuprofen 200 MG tablet Commonly known as: ADVIL     TAKE these medications   carvedilol 3.125 MG tablet Commonly known as: COREG Take 3.125 mg by mouth 2 (two) times daily with a meal.    HYDROcodone-acetaminophen 5-325 MG tablet Commonly known as: NORCO/VICODIN Take 2 tablets by mouth every 6 (six) hours as needed for severe pain ((score 7 to 10)).   levothyroxine 125 MCG tablet Commonly known as: SYNTHROID Take 125 mcg by mouth daily before breakfast.   methocarbamol 500 MG tablet Commonly known as: ROBAXIN Take 1 tablet (500 mg total) by mouth every 8 (eight) hours as needed for muscle spasms.   multivitamin with minerals Tabs tablet Take 1 tablet by mouth daily.       Follow-up Information    Erline Levine, MD Follow up in 3 week(s).   Specialty: Neurosurgery Why: call for appointment Contact information: [address] [place] [address] [phone]               Signed: Theodoro Doing Ladislaus Repsher 05/03/2020, 12:12 PM

## 2020-06-16 ENCOUNTER — Other Ambulatory Visit: Payer: Self-pay | Admitting: Student

## 2020-06-16 DIAGNOSIS — S62306A Unspecified fracture of fifth metacarpal bone, right hand, initial encounter for closed fracture: Secondary | ICD-10-CM

## 2020-09-11 IMAGING — RF DG C-ARM 1-60 MIN
1 series · 2 of 2 positions shown · non-contrast
Comparison: Lumbar spine MRI 11/11/2019

CLINICAL DATA: Surgery, elective. Additional history provided:
L5-S1 ALIF. Reported fluoroscopy time 37 seconds, 23.05 mGy.

EXAM:
LUMBAR SPINE - 2-3 VIEW; DG C-ARM 1-60 MIN

[Series 1: run · 2 of 2 slices shown]
[im 1/2]
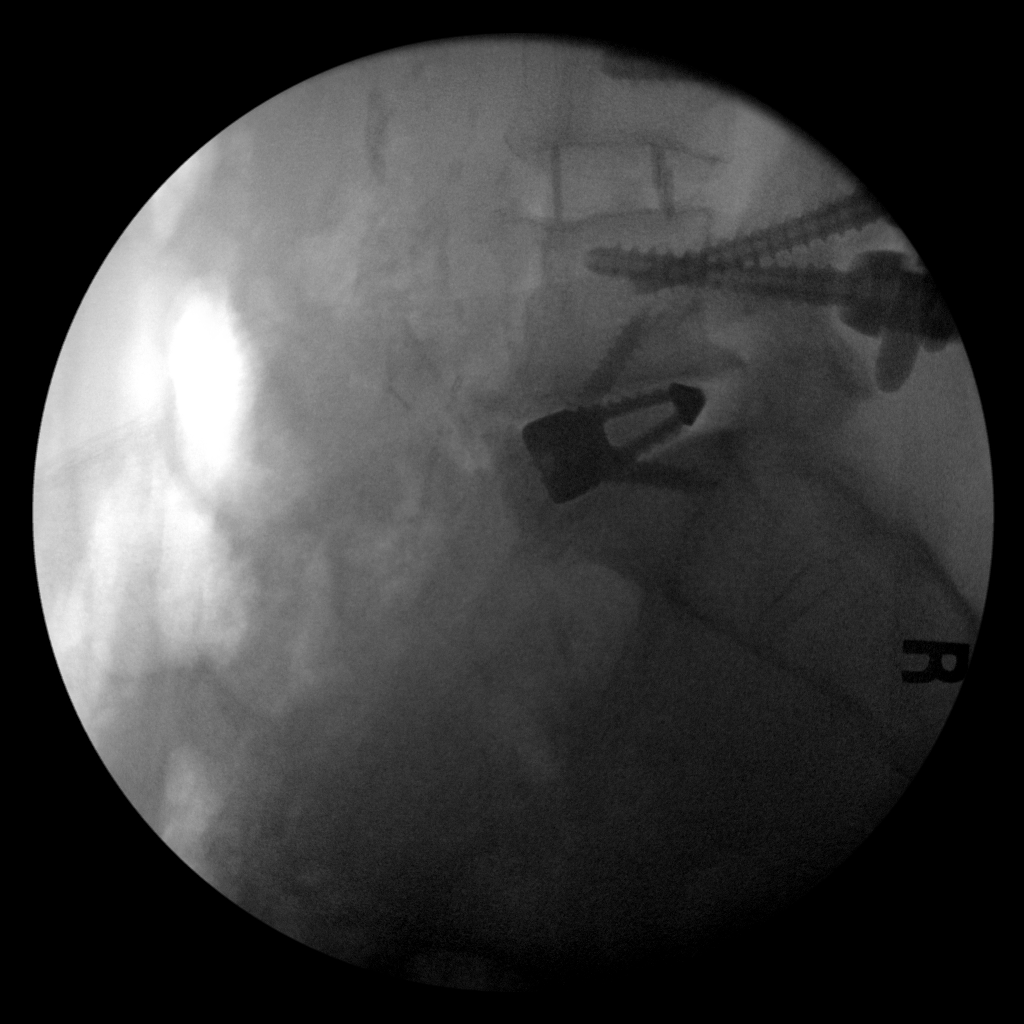
[im 2/2]
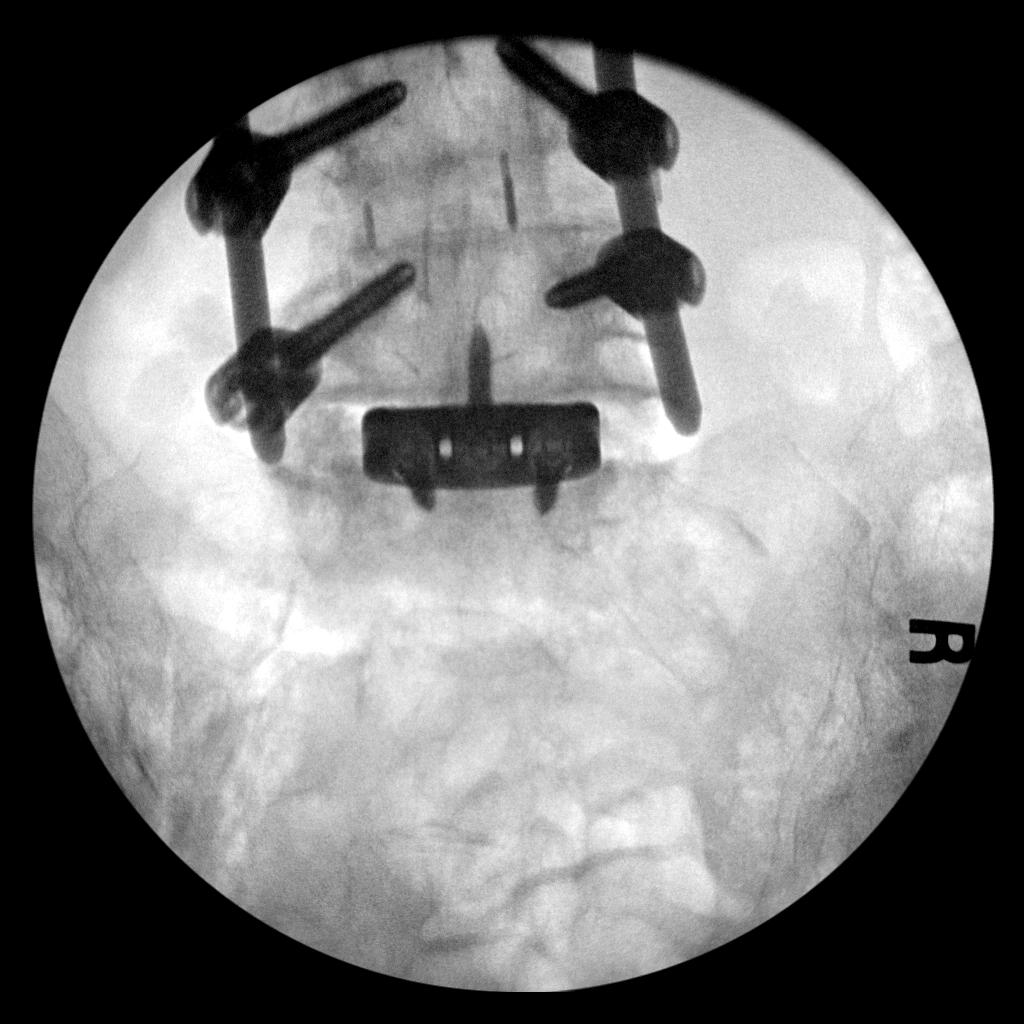

[2 of 2 positions shown; findings below may reference images not displayed]

FINDINGS: Two intraoperative fluoroscopic images of the lower lumbar spine are
submitted for evaluation, one lateral and one AP. Sequela of
interval disc replacement and anterior fusion at L5-S1. Partially
visualized bilateral pedicle screws and vertical interconnecting
rods at L4-L5. The vertical interconnecting rods extend cephalad
beyond the field of view. L4-L5 interbody spacer. No unexpected
finding.
IMPRESSION: Two intraoperative fluoroscopic images of the lower lumbar spine as
described.

## 2020-09-11 IMAGING — CR DG OR LOCAL ABDOMEN
1 series · 1 of 1 positions shown · non-contrast
Comparison: None.

CLINICAL DATA: Intraoperative study for assessment for potential
retained foreign body

EXAM:
OR LOCAL ABDOMEN

[AP]
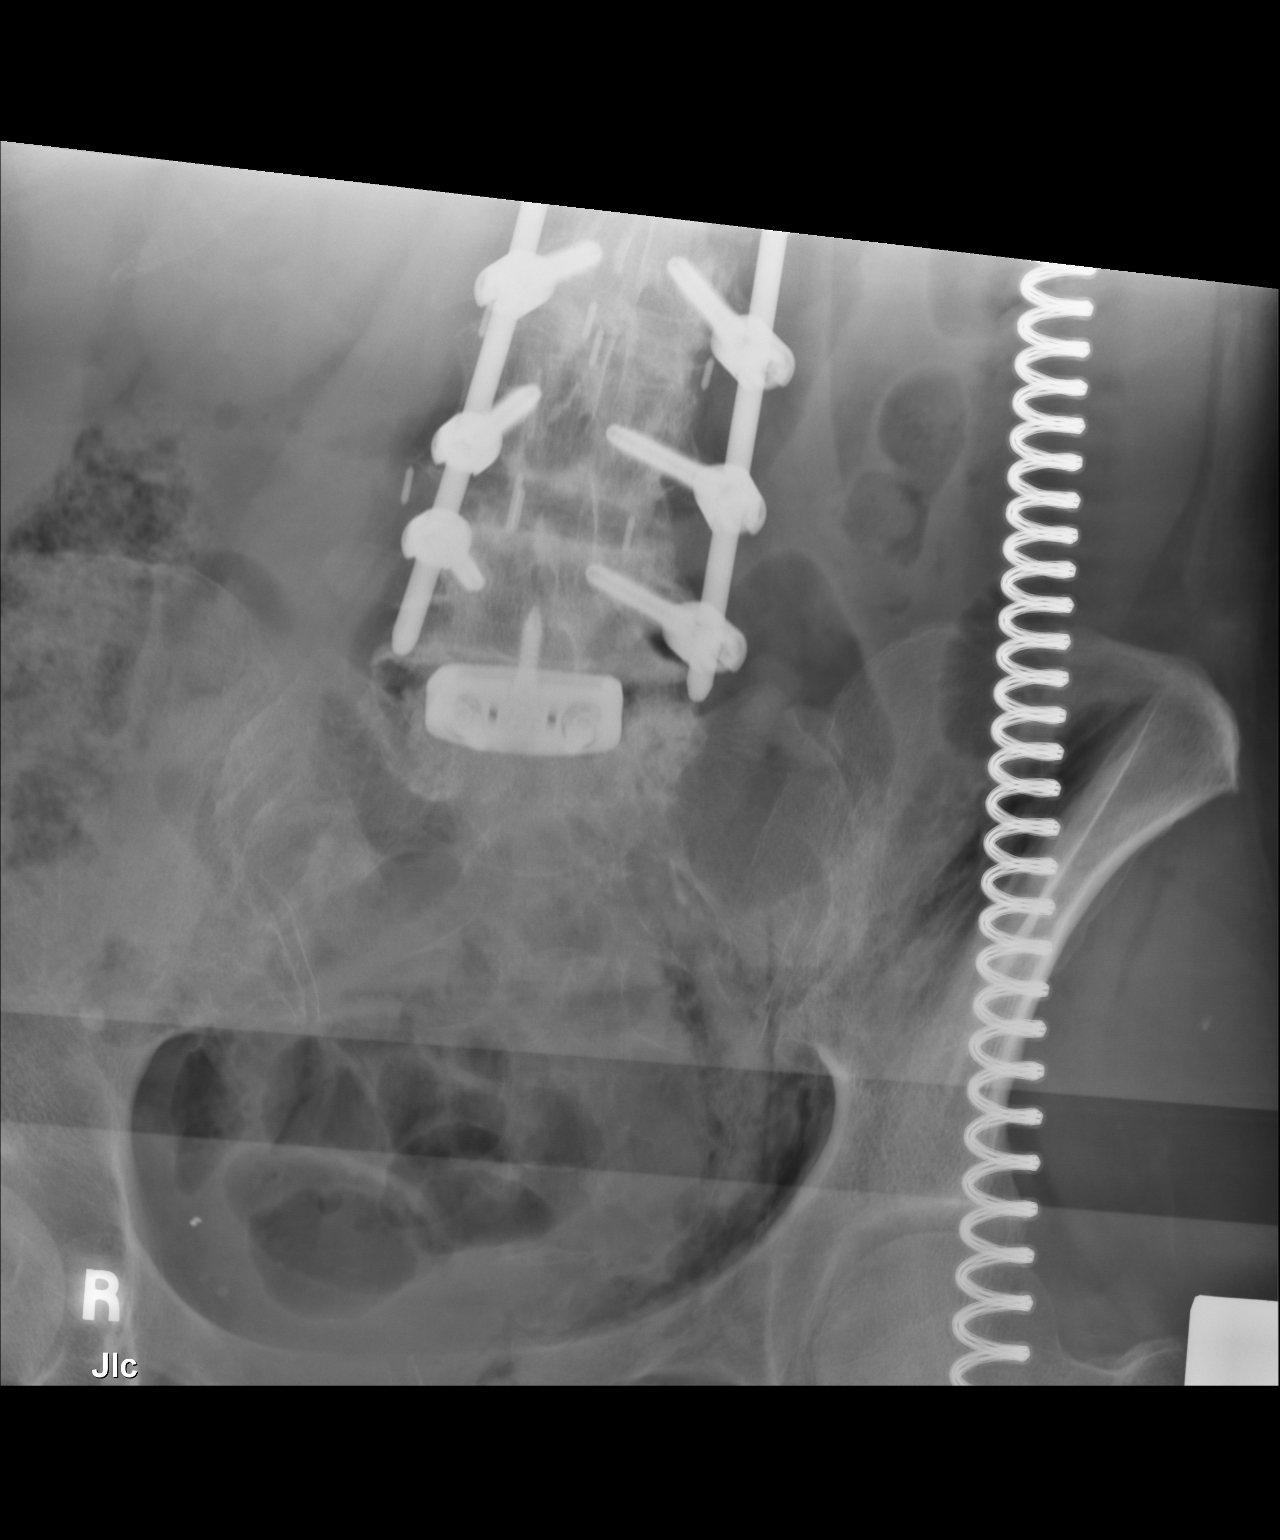

[1 of 1 positions shown; findings below may reference images not displayed]

FINDINGS: Extensive postoperative change in the lumbar spine evident. No
retained needle or sponge evident. No bowel obstruction or free air.
Moderate stool in colon.
IMPRESSION: No retained foreign body. Extensive postoperative change in lumbar
spine. No bowel obstruction or free air.

Critical Value/emergent results were called by telephone at the time
of interpretation on 04/27/2020 at [DATE] to provider Chedza
Lienad, RN, who verbally acknowledged these results.

## 2020-09-11 IMAGING — RF DG LUMBAR SPINE 2-3V
1 series · 2 of 2 positions shown · non-contrast
Comparison: Lumbar spine MRI 11/11/2019

CLINICAL DATA: Surgery, elective. Additional history provided:
L5-S1 ALIF. Reported fluoroscopy time 37 seconds, 23.05 mGy.

EXAM:
LUMBAR SPINE - 2-3 VIEW; DG C-ARM 1-60 MIN

[Series 1: run · 2 of 2 slices shown]
[im 1/2]
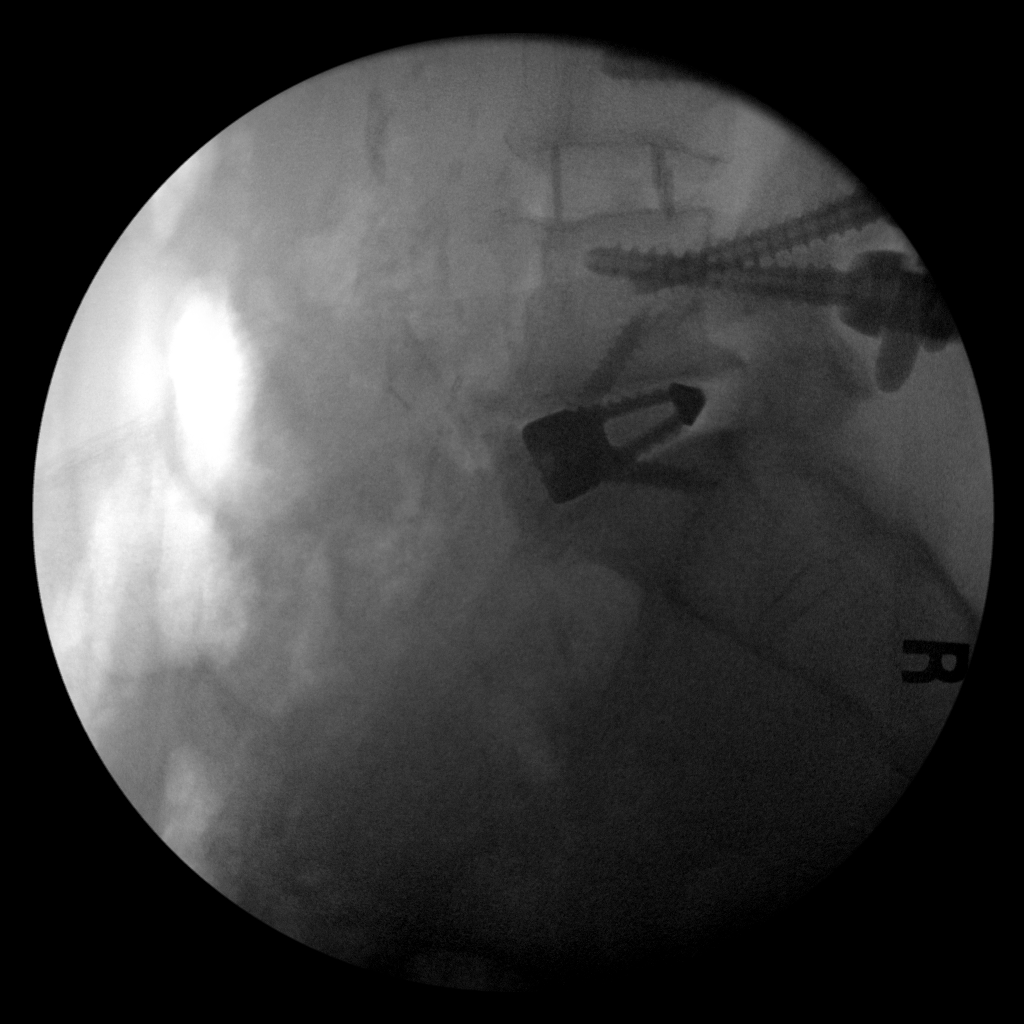
[im 2/2]
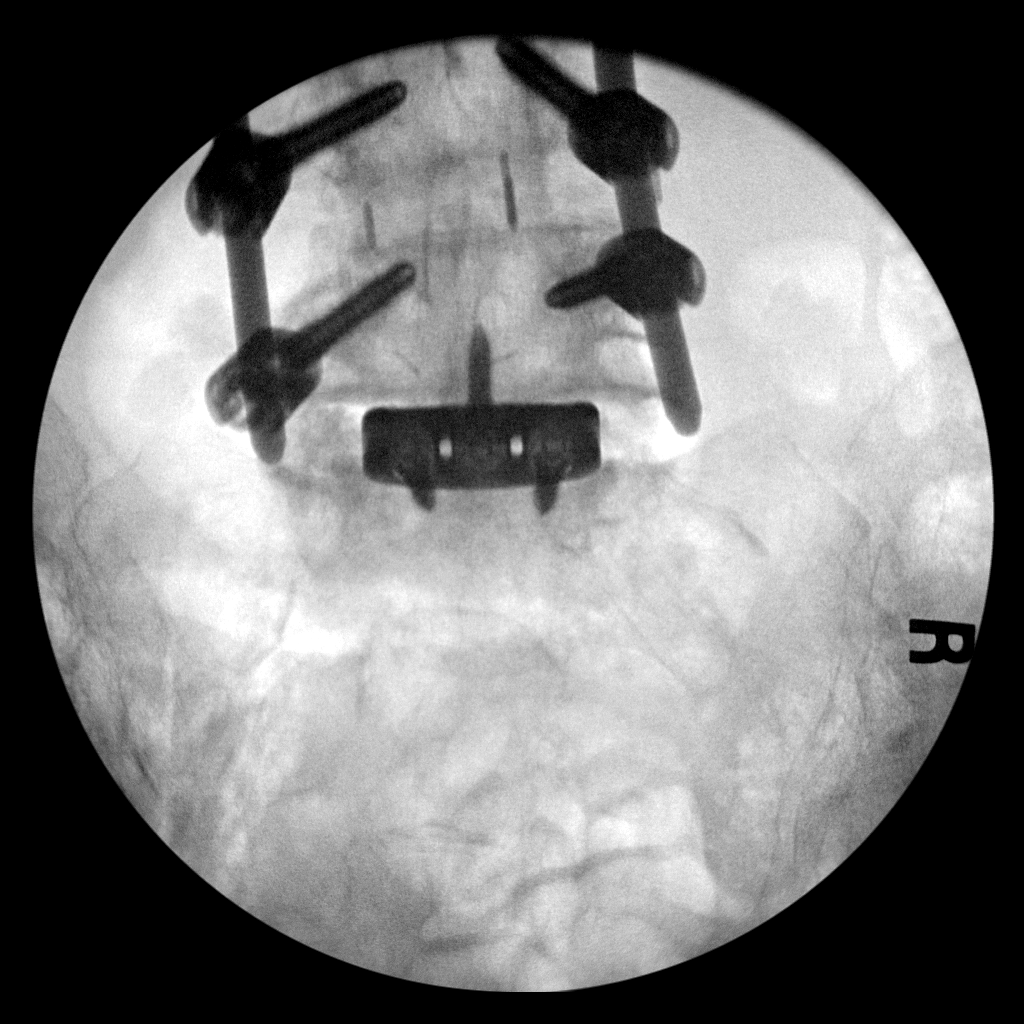

[2 of 2 positions shown; findings below may reference images not displayed]

FINDINGS: Two intraoperative fluoroscopic images of the lower lumbar spine are
submitted for evaluation, one lateral and one AP. Sequela of
interval disc replacement and anterior fusion at L5-S1. Partially
visualized bilateral pedicle screws and vertical interconnecting
rods at L4-L5. The vertical interconnecting rods extend cephalad
beyond the field of view. L4-L5 interbody spacer. No unexpected
finding.
IMPRESSION: Two intraoperative fluoroscopic images of the lower lumbar spine as
described.

## 2024-08-02 NOTE — Progress Notes (Signed)
 New Patient Pulmonology Office Visit   Subjective:  Patient ID: Tammy Booth, female    DOB: 08-14-1956  MRN: [id number]  Referred by: Tammy Booth,*  CC: No chief complaint on file.   HPI Tammy Booth is a 69 y.o. 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  {PULM QUESTIONNAIRES (Optional):33196}  ROS  Allergies: Patient has no known allergies.  Current Outpatient Medications:    carvedilol  (COREG ) 3.125 MG tablet, Take 3.125 mg by mouth 2 (two) times daily with a meal., Disp: , Rfl:    HYDROcodone -acetaminophen  (NORCO/VICODIN) 5-325 MG tablet, Take 2 tablets by mouth every 6 (six) hours as needed for severe pain ((score 7 to 10))., Disp: 30 tablet, Rfl: 0   levothyroxine  (SYNTHROID ) 125 MCG tablet, Take 125 mcg by mouth daily before breakfast., Disp: , Rfl:    methocarbamol  (ROBAXIN ) 500 MG tablet, Take 1 tablet (500 mg total) by mouth every 8 (eight) hours as needed for muscle spasms., Disp: 90 tablet, Rfl: 0   Multiple Vitamin (MULTIVITAMIN WITH MINERALS) TABS tablet, Take 1 tablet by mouth daily., Disp: , Rfl:  Past Medical History:  Diagnosis Date   Back pain    Cancer (HCC) 2015   melanoma   Complication of anesthesia    Trouble waking x 2   Constipation    DDD (degenerative disc disease), lumbar    Fatigue    History of kidney stones 1981   Hypertension    PRIMARY   Hypothyroidism    Left hip pain    Left leg pain    Night sweats    Pneumonia    Radiculopathy    Lumbar region   Thyroid disease    Past Surgical History:  Procedure Laterality Date   ABDOMINAL EXPOSURE N/A 04/27/2020   Procedure: ABDOMINAL EXPOSURE FOR LUMBAR FIVE SACRAL ONE;  Surgeon: Serene Gaile ORN, MD;  Location: [facility] OR;  Service: Vascular;  Laterality: N/A;   ABDOMINAL HYSTERECTOMY     ANTERIOR LUMBAR FUSION N/A 04/27/2020   Procedure: Lumbar five-Sacral one Anterior lumbar interbody fusion with Thoracic ten to  Lumbar one Fixation, Sacral one to pelvis fixation, exploration of previous fusion;  Surgeon: Unice Pac, MD;  Location: [facility] OR;  Service: Neurosurgery;  Laterality: N/A;   APPLICATION OF INTRAOPERATIVE CT SCAN N/A 04/27/2020   Procedure: APPLICATION OF INTRAOPERATIVE CT SCAN;  Surgeon: Unice Pac, MD;  Location[facility] OR;  Service: Neurosurgery;  Laterality: N/A;   BACK SURGERY  2012   CATARACT EXTRACTION, BILATERAL  2017   CHOLECYSTECTOMY     EYE SURGERY  2017   GALLBLADDER SURGERY     IDIOPATHIC SCOLIOSIS     SPINAL FUSION     L1 - L5   TONSILLECTOMY     Family History  Problem Relation Age of Onset   Diabetes Mother    Cancer Mother    Hypertension Mother    Heart disease Father    Hypertension Father    Cancer Father    Cancer Sister    Hypertension Sister    Diabetes Other    Cancer Other    Dementia Other    Social History   Socioeconomic History   Marital status: Widowed    Spouse name: Not on file   Number of children: Not on file   Years of education: Not on file   Highest education level: Not on file  Occupational History   Occupation: Disabled  Tobacco Use   Smoking status: Every Day    Current packs/day: 0.50    Average packs/day: 0.5 packs/day for 44.0 years (22.0 ttl pk-yrs)    Types: Cigarettes   Smokeless tobacco: Never  Vaping Use   Vaping status: Never Used  Substance and Sexual Activity   Alcohol use: Never   Drug use: Never   Sexual activity: Not Currently  Other Topics Concern   Not on file  Social History Narrative   Not on file   Social Drivers of Health   Financial Resource Strain: Not on file  Food Insecurity: Not on file  Transportation Needs: Not on file  Physical Activity: Not on file  Stress: Not on file  Social Connections: Not on file  Intimate Partner Violence: Not on file       Objective:  There were no vitals taken for this visit. {Pulm Vitals (Optional):32837}  Physical Exam  Diagnostic Review:  {Labs  (Optional):32838}  PFTs that show moderately severe obstructive defect.   CT Chest 05/2023: IMPRESSION: Persistent consolidation involving the inferior right upper lobe, right middle lobe and anterior right lower lobe with patchy reticular densities of the right lower lobe. Though this this may reflect chronic pneumonia, underlying malignancy is not excluded, in particular involving the right middle lobe which appears more masslike.   CT Chest 07/2023: There is been a marked improvement in the somewhat nodular infiltrative change within the inferior aspect of the right lobe, right middle lobe, and the right lower lobe, with only mild peribronchial thickening, infiltration, and volume loss presently. No new regions of consolidation are evident, and there is no significant pleural effusion. The limited images through the upper abdomen demonstrate no significant findings. Impression: Significant improvement as described.   CT Chest 12/2023: Impression: Stable right middle lobe opacities and slight improvement in irregular opacities in the right lower lobe which could be secondary to atypical infection or atelectasis.     Assessment & Plan:   Assessment & Plan   No orders of the defined types were placed in this encounter.     No follow-ups on file.   Tammy Kowalchuk, MD

## 2024-08-03 ENCOUNTER — Encounter: Payer: Self-pay | Admitting: Pulmonary Disease

## 2024-08-03 ENCOUNTER — Ambulatory Visit: Admitting: Pulmonary Disease

## 2024-08-03 VITALS — BP 124/68 | HR 84 | Ht 65.0 in | Wt 142.6 lb

## 2024-08-03 DIAGNOSIS — J439 Emphysema, unspecified: Secondary | ICD-10-CM | POA: Diagnosis not present

## 2024-08-03 DIAGNOSIS — Z122 Encounter for screening for malignant neoplasm of respiratory organs: Secondary | ICD-10-CM

## 2024-08-03 DIAGNOSIS — F17210 Nicotine dependence, cigarettes, uncomplicated: Secondary | ICD-10-CM | POA: Diagnosis not present

## 2024-08-03 DIAGNOSIS — Z23 Encounter for immunization: Secondary | ICD-10-CM

## 2024-08-03 MED ORDER — ALBUTEROL SULFATE HFA 108 (90 BASE) MCG/ACT IN AERS: 2.0000 | INHALATION_SPRAY | RESPIRATORY_TRACT | 6 refills | Status: AC | PRN

## 2024-08-03 NOTE — Patient Instructions (Signed)
  VISIT SUMMARY: During your visit, we discussed your progress with smoking cessation, respiratory health, and weight management. You have successfully reduced your smoking and are managing your COPD with albuterol  as needed. We also addressed your concerns about weight gain and provided dietary advice.  YOUR PLAN: LUNG CANCER SCREENING SURVEILLANCE: Continued annual CT surveillance for lung cancer screening. -We have ordered your lung cancer screening CT scan for April 2026 and coordinated with the nurse navigator for scheduling.  TOBACCO USE DISORDER: You have reduced your smoking to half a pack per day and are concerned about weight gain if you quit completely. -Start taking Wellbutrin as prescribed to help with smoking cessation.  CHRONIC OBSTRUCTIVE PULMONARY DISEASE (COPD): You are managing your COPD with albuterol  as needed and experience occasional shortness of breath with exertion. -Continue using albuterol  as needed for shortness of breath.  OBESITY: You have gained weight due to decreased activity and increased food intake. -Make dietary modifications to help manage your weight.  Contains text generated by [employer].

## 2024-08-09 ENCOUNTER — Other Ambulatory Visit: Payer: Self-pay

## 2024-08-09 DIAGNOSIS — F17210 Nicotine dependence, cigarettes, uncomplicated: Secondary | ICD-10-CM

## 2024-08-09 DIAGNOSIS — Z122 Encounter for screening for malignant neoplasm of respiratory organs: Secondary | ICD-10-CM

## 2024-08-09 DIAGNOSIS — Z87891 Personal history of nicotine dependence: Secondary | ICD-10-CM

## 2025-01-06 ENCOUNTER — Ambulatory Visit: Admitting: Pulmonary Disease
